# Patient Record
Sex: Male | Born: 1950 | Race: White | Hispanic: No | Marital: Married | Smoking: Former smoker
Health system: Southern US, Community
[De-identification: ages and names within clinical notes are randomized; demographics above are authoritative.]

## PROBLEM LIST (undated history)

## (undated) DIAGNOSIS — F17200 Nicotine dependence, unspecified, uncomplicated: Secondary | ICD-10-CM

---

## 2017-04-30 DIAGNOSIS — K7460 Unspecified cirrhosis of liver: Secondary | ICD-10-CM | POA: Insufficient documentation

## 2017-04-30 HISTORY — DX: Unspecified cirrhosis of liver: K74.60

## 2018-07-22 ENCOUNTER — Telehealth: Payer: Self-pay

## 2018-07-22 NOTE — Telephone Encounter (Signed)
   Pt informed of visitor's policy and to wear a mask. Pt voiced understanding.  COVID-19 Pre-Screening Questions:  . In the past 7 to 10 days have you had a cough,  shortness of breath, headache, congestion, fever (100 or greater) body aches, chills, sore throat, or sudden loss of taste or sense of smell? NO . Have you been around anyone with known Covid 19.NO . Have you been around anyone who is awaiting Covid 19 test results in the past 7 to 10 days?NO . Have you been around anyone who has been exposed to Covid 19, or has mentioned symptoms of Covid 19 within the past 7 to 10 days? NO  If you have any concerns/questions about symptoms patients report during screening (either on the phone or at threshold). Contact the provider seeing the patient or DOD for further guidance.  If neither are available contact a member of the leadership team.           

## 2018-07-25 ENCOUNTER — Telehealth: Payer: Self-pay | Admitting: Cardiology

## 2018-07-25 NOTE — Telephone Encounter (Signed)
I called pt to confirm appt for 07-26-18 with Dr Harrell Gave.

## 2018-07-26 ENCOUNTER — Other Ambulatory Visit: Payer: Self-pay

## 2018-07-26 ENCOUNTER — Encounter: Payer: Self-pay | Admitting: Cardiology

## 2018-07-26 ENCOUNTER — Ambulatory Visit (INDEPENDENT_AMBULATORY_CARE_PROVIDER_SITE_OTHER): Payer: Medicare Other | Admitting: Cardiology

## 2018-07-26 VITALS — BP 102/64 | HR 89 | Temp 98.2°F | Ht 68.0 in | Wt 138.0 lb

## 2018-07-26 DIAGNOSIS — Z7189 Other specified counseling: Secondary | ICD-10-CM

## 2018-07-26 DIAGNOSIS — R600 Localized edema: Secondary | ICD-10-CM

## 2018-07-26 DIAGNOSIS — F17210 Nicotine dependence, cigarettes, uncomplicated: Secondary | ICD-10-CM | POA: Diagnosis not present

## 2018-07-26 DIAGNOSIS — Z716 Tobacco abuse counseling: Secondary | ICD-10-CM

## 2018-07-26 NOTE — Progress Notes (Signed)
Cardiology Office Note:    Date:  07/26/2018   ID:  Obryan, Radu 03/29/1950, MRN [id number]  PCP:  Tempie Hoist, FNP  Cardiologist:  Buford Dresser, MD PhD  Referring MD: No ref. provider found   CC: New patient visit for CV evaluation, LE edema  History of Present Illness:    Bryan Peterson is a 68 y.o. male with a hx of liver masses who is seen as a new patient for the evaluation and management of CV risk and LE edema.  HPI 11/2017, started losing appetite. In the end of march, had foot swelling. Put on diuretic, increased BP medication. Had a colonoscopy last week, Friday met with GI specialist at [facility], concern was for multiple liver lesions being cancer. No cancer diagnosis to this point. Continuing to follow with [facility] for further workup.   He does not have any known history of heart disease, but with everything going on with his health, he wants to make sure that he isn't missing something.  Denies chest pain, shortness of breath at rest or with normal exertion. No PND, orthopnea, or unexpected weight gain (stable now but lost 40 lbs since 11/2017). No syncope or palpitations. Has chronic LE edema that responded to diuretics in the past.   Cardiovascular risk factors: Prior clinical ASCVD:  None that he knows up Comorbid conditions, including hypertension, hyperlipidemia, diabetes, chronic kidney disease: was on lisinopril, but now off since losing weight. No HLD, DM, CKD. Does have history of liver disease, has been followed since mid-April for this.  Metabolic syndrome/Obesity: none Chronic inflammatory conditions: none Tobacco use history: current 1ppd for ~30 years.  Alcohol: down to 1 oz/night. Peak was 3 fifths/week of bourbon about a month ago. Family history: none for heart or stroke Prior cardiac testing and/or incidental findings on other testing (ie coronary calcium): none Exercise level: can work for 20-30 min in the yard, then needs to take a break.   Current diet: poor, minimal appetite  No prior surgeries.  Current Medications: Current Outpatient Medications on File Prior to Visit  Medication Sig  . omeprazole (PRILOSEC) 20 MG capsule Take 20 mg by mouth daily.   No current facility-administered medications on file prior to visit.      Allergies:   Patient has no known allergies.   Social History   Socioeconomic History  . Marital status: Married    Spouse name: Not on file  . Number of children: Not on file  . Years of education: Not on file  . Highest education level: Not on file  Occupational History  . Not on file  Social Needs  . Financial resource strain: Not on file  . Food insecurity    Worry: Not on file    Inability: Not on file  . Transportation needs    Medical: Not on file    Non-medical: Not on file  Tobacco Use  . Smoking status: Former Research scientist (life sciences)  . Smokeless tobacco: Former Network engineer and Sexual Activity  . Alcohol use: Not on file  . Drug use: Not on file  . Sexual activity: Not on file  Lifestyle  . Physical activity    Days per week: Not on file    Minutes per session: Not on file  . Stress: Not on file  Relationships  . Social Herbalist on phone: Not on file    Gets together: Not on file    Attends religious service: Not on file  Active member of club or organization: Not on file    Attends meetings of clubs or organizations: Not on file    Relationship status: Not on file  Other Topics Concern  . Not on file  Social History Narrative  . Not on file     Family History: The patient's family history includes Alzheimer's disease in his mother; Throat cancer in his father.  ROS:   Please see the history of present illness.  Additional pertinent ROS:  Constitutional: Negative for chills, fever, night sweats. Positive for unintentional weight loss and fatigue. HENT: Negative for ear pain and hearing loss.   Eyes: Negative for loss of vision and eye pain.   Respiratory: Negative for cough, sputum, shortness of breath, wheezing.   Cardiovascular: See HPI. Gastrointestinal: Negative for abdominal pain, melena, and hematochezia.  Genitourinary: Negative for dysuria and hematuria.  Musculoskeletal: Negative for falls and myalgias.  Skin: Negative for itching and rash.  Neurological: Negative for focal weakness, focal sensory changes and loss of consciousness.  Endo/Heme/Allergies: Does not bruise/bleed easily.    EKGs/Labs/Other Studies Reviewed:    The following studies were reviewed today: Notes in Care Everywhere  EKG:  EKG is personally reviewed.  The ekg ordered today demonstrates NSR< low voltage limb leads  Recent Labs: No results found for requested labs within last 8760 hours.  Recent Lipid Panel No results found for: CHOL, TRIG, HDL, CHOLHDL, VLDL, LDLCALC, LDLDIRECT  Physical Exam:    VS:  BP 102/64 (BP Location: Right Arm, Patient Position: Sitting, Cuff Size: Normal)   Pulse 89   Temp 98.2 F (36.8 C)   Ht '5\' 8"'  (1.727 m)   Wt 138 lb (62.6 kg)   BMI 20.98 kg/m     Wt Readings from Last 3 Encounters:  07/26/18 138 lb (62.6 kg)     GEN: Frail appearing gentleman in no acute distress HEENT: Normal NECK: No JVD; No carotid bruits LYMPHATICS: No appreciable lymphadenopathy CARDIAC: regular rhythm, normal S1 and S2, no murmurs, rubs, gallops. Radial and DP pulses 2+ bilaterally. RESPIRATORY:  Clear to auscultation without rales, wheezing or rhonchi  ABDOMEN: Soft, non-tender, non-distended, +fluid wave MUSCULOSKELETAL:  Bilateral 1+ pitting edema; No deformity  SKIN: Warm and dry NEUROLOGIC:  Alert and oriented x 3 PSYCHIATRIC:  Normal affect   ASSESSMENT:    1. Lower extremity edema   2. Cardiac risk counseling    PLAN:    LE edema: based on review of notes from [place], he has multiple liver masses and is was investigated for malignancy. 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 -it is reasonable to perform echo to rule out cardiac etiology of his edema. However, I suspect that his edema is largely due to his cirrhosis. -if his echo is unremarkable, I would not pursue additional cardiovascular workup at this time -counseled on alcohol cessation  Tobacco use: The patient was counseled on tobacco cessation today for 4 minutes.  Counseling included reviewing the risks of smoking tobacco products, how it impacts the patient's current medical diagnoses and different strategies for quitting.  Pharmacotherapy to aid in tobacco cessation was not prescribed today.  Cardiac risk counseling and prevention recommendations: -recommend heart healthy/Mediterranean diet, with whole grains, fruits, vegetable, fish, lean meats, nuts, and olive oil. Limit salt. -recommend moderate walking, 3-5 times/week  for 30-50 minutes each session. Aim for at least 150 minutes.week. Goal should be pace of 3 miles/hours, or walking 1.5 miles in 30 minutes -recommend avoidance of tobacco products. Avoid excess alcohol.  Plan for follow up: PRN  Medication Adjustments/Labs and Tests Ordered: Current medicines are reviewed at length with the patient today.  Concerns regarding medicines are outlined above.  Orders Placed This Encounter  Procedures  . EKG 12-Lead  . ECHOCARDIOGRAM COMPLETE   No orders of the defined types were placed in this encounter.   Patient Instructions  Medication Instructions:  Your Physician recommend you continue on your current medication as directed.    If you need a refill on your cardiac medications before your next appointment, please call your pharmacy.   Lab work: None  Testing/Procedures: Your physician has requested that you have an echocardiogram. Echocardiography is a painless test  that uses sound waves to create images of your heart. It provides your doctor with information about the size and shape of your heart and how well your heart's chambers and valves are working. This procedure takes approximately one hour. There are no restrictions for this procedure. [address]   Follow-Up: At [facility], you and your health needs are our priority.  As part of our continuing mission to provide you with exceptional heart care, we have created designated Provider Care Teams.  These Care Teams include your primary Cardiologist (physician) and Advanced Practice Providers (APPs -  Physician Assistants and Nurse Practitioners) who all work together to provide you with the care you need, when you need it. You will need a follow up appointment as needed .  Please call our office 2 months in advance to schedule this appointment.  You may see Dr. Harrell Gave or one of the following Advanced Practice Providers on your designated Care Team:   Rosaria Ferries, PA-C . Jory Sims, DNP, ANP       Signed, Buford Dresser, MD PhD 07/26/2018 4:49 PM    [place]

## 2018-07-26 NOTE — Patient Instructions (Signed)
Medication Instructions:  Your Physician recommend you continue on your current medication as directed.    If you need a refill on your cardiac medications before your next appointment, please call your pharmacy.   Lab work: None  Testing/Procedures: Your physician has requested that you have an echocardiogram. Echocardiography is a painless test that uses sound waves to create images of your heart. It provides your doctor with information about the size and shape of your heart and how well your heart's chambers and valves are working. This procedure takes approximately one hour. There are no restrictions for this procedure. [address] [address]   Follow-Up: At [facility], you and your health needs are our priority.  As part of our continuing mission to provide you with exceptional heart care, we have created designated Provider Care Teams.  These Care Teams include your primary Cardiologist (physician) and Advanced Practice Providers (APPs -  Physician Assistants and Nurse Practitioners) who all work together to provide you with the care you need, when you need it. You will need a follow up appointment as needed .  Please call our office 2 months in advance to schedule this appointment.  You may see Dr. Harrell Gave or one of the following Advanced Practice Providers on your designated Care Team:   Rosaria Ferries, PA-C . Jory Sims, DNP, ANP

## 2018-07-31 ENCOUNTER — Encounter: Payer: Self-pay | Admitting: Cardiology

## 2018-08-09 ENCOUNTER — Other Ambulatory Visit: Payer: Self-pay

## 2018-08-09 ENCOUNTER — Ambulatory Visit (HOSPITAL_COMMUNITY): Payer: Medicare Other | Attending: Cardiology

## 2018-08-09 DIAGNOSIS — R600 Localized edema: Secondary | ICD-10-CM | POA: Insufficient documentation

## 2020-08-28 ENCOUNTER — Other Ambulatory Visit: Payer: Self-pay

## 2020-08-28 ENCOUNTER — Inpatient Hospital Stay (HOSPITAL_COMMUNITY): Payer: Medicare Other

## 2020-08-28 ENCOUNTER — Inpatient Hospital Stay (HOSPITAL_COMMUNITY)
Admission: EM | Admit: 2020-08-28 | Discharge: 2020-08-31 | DRG: 488 | Disposition: A | Discharge status: Home Health | Payer: Medicare Other | Source: Other Acute Inpatient Hospital | Attending: Orthopedic Surgery | Admitting: Orthopedic Surgery

## 2020-08-28 ENCOUNTER — Encounter (HOSPITAL_COMMUNITY): Payer: Self-pay

## 2020-08-28 DIAGNOSIS — Z808 Family history of malignant neoplasm of other organs or systems: Secondary | ICD-10-CM

## 2020-08-28 DIAGNOSIS — S82141A Displaced bicondylar fracture of right tibia, initial encounter for closed fracture: Secondary | ICD-10-CM | POA: Diagnosis present

## 2020-08-28 DIAGNOSIS — Z87891 Personal history of nicotine dependence: Secondary | ICD-10-CM | POA: Diagnosis not present

## 2020-08-28 DIAGNOSIS — Z820 Family history of epilepsy and other diseases of the nervous system: Secondary | ICD-10-CM

## 2020-08-28 DIAGNOSIS — Z20822 Contact with and (suspected) exposure to covid-19: Secondary | ICD-10-CM | POA: Diagnosis present

## 2020-08-28 DIAGNOSIS — K7460 Unspecified cirrhosis of liver: Secondary | ICD-10-CM | POA: Diagnosis present

## 2020-08-28 DIAGNOSIS — W11XXXA Fall on and from ladder, initial encounter: Secondary | ICD-10-CM | POA: Diagnosis present

## 2020-08-28 DIAGNOSIS — F17200 Nicotine dependence, unspecified, uncomplicated: Secondary | ICD-10-CM | POA: Diagnosis present

## 2020-08-28 DIAGNOSIS — K7469 Other cirrhosis of liver: Secondary | ICD-10-CM | POA: Diagnosis present

## 2020-08-28 DIAGNOSIS — T148XXA Other injury of unspecified body region, initial encounter: Secondary | ICD-10-CM

## 2020-08-28 DIAGNOSIS — D62 Acute posthemorrhagic anemia: Secondary | ICD-10-CM | POA: Diagnosis not present

## 2020-08-28 DIAGNOSIS — Z419 Encounter for procedure for purposes other than remedying health state, unspecified: Secondary | ICD-10-CM

## 2020-08-28 HISTORY — DX: Nicotine dependence, unspecified, uncomplicated: F17.200

## 2020-08-28 LAB — VITAMIN D 25 HYDROXY (VIT D DEFICIENCY, FRACTURES): Vit D, 25-Hydroxy: 35.31 ng/mL (ref 30–100)

## 2020-08-28 LAB — COMPREHENSIVE METABOLIC PANEL
ALT: 8 U/L (ref 0–44)
AST: 18 U/L (ref 15–41)
Albumin: 3.3 g/dL — ABNORMAL LOW (ref 3.5–5.0)
Alkaline Phosphatase: 77 U/L (ref 38–126)
Anion gap: 5 (ref 5–15)
BUN: 14 mg/dL (ref 8–23)
CO2: 25 mmol/L (ref 22–32)
Calcium: 8.6 mg/dL — ABNORMAL LOW (ref 8.9–10.3)
Chloride: 106 mmol/L (ref 98–111)
Creatinine, Ser: 1.36 mg/dL — ABNORMAL HIGH (ref 0.61–1.24)
GFR, Estimated: 56 mL/min — ABNORMAL LOW (ref >60)
Glucose, Bld: 99 mg/dL (ref 70–99)
Potassium: 3.7 mmol/L (ref 3.5–5.1)
Sodium: 136 mmol/L (ref 135–145)
Total Bilirubin: 1 mg/dL (ref 0.3–1.2)
Total Protein: 6.3 g/dL — ABNORMAL LOW (ref 6.5–8.1)

## 2020-08-28 LAB — CBC
HCT: 36.1 % — ABNORMAL LOW (ref 39.0–52.0)
Hemoglobin: 12 g/dL — ABNORMAL LOW (ref 13.0–17.0)
MCH: 32 pg (ref 26.0–34.0)
MCHC: 33.2 g/dL (ref 30.0–36.0)
MCV: 96.3 fL (ref 80.0–100.0)
Platelets: 205 10*3/uL (ref 150–400)
RBC: 3.75 MIL/uL — ABNORMAL LOW (ref 4.22–5.81)
RDW: 13.4 % (ref 11.5–15.5)
WBC: 7.3 10*3/uL (ref 4.0–10.5)
nRBC: 0 % (ref 0.0–0.2)

## 2020-08-28 LAB — RESP PANEL BY RT-PCR (FLU A&B, COVID) ARPGX2
Influenza A by PCR: NEGATIVE
Influenza B by PCR: NEGATIVE
SARS Coronavirus 2 by RT PCR: NEGATIVE

## 2020-08-28 MED ORDER — OXYCODONE HCL 5 MG PO TABS
5.0000 mg | ORAL_TABLET | ORAL | Status: DC | PRN
Start: 2020-08-28 — End: 2020-08-31
  Administered 2020-08-28 – 2020-08-30 (×2): 5 mg via ORAL
  Filled 2020-08-28: qty 1
  Filled 2020-08-28: qty 2
  Filled 2020-08-28: qty 1

## 2020-08-28 MED ORDER — ACETAMINOPHEN 325 MG PO TABS
650.0000 mg | ORAL_TABLET | Freq: Four times a day (QID) | ORAL | Status: DC
  Administered 2020-08-29 – 2020-08-31 (×9): 650 mg via ORAL
  Filled 2020-08-28 (×9): qty 2

## 2020-08-28 MED ORDER — ONDANSETRON HCL 4 MG PO TABS
4.0000 mg | ORAL_TABLET | Freq: Four times a day (QID) | ORAL | Status: DC | PRN
  Administered 2020-08-29: 4 mg via ORAL
  Filled 2020-08-28: qty 1

## 2020-08-28 MED ORDER — POTASSIUM CHLORIDE IN NACL 20-0.9 MEQ/L-% IV SOLN
INTRAVENOUS | Status: DC
Start: 2020-08-28 — End: 2020-08-30
  Filled 2020-08-28: qty 1000

## 2020-08-28 MED ORDER — HYDRALAZINE HCL 10 MG PO TABS: 10.0000 mg | ORAL_TABLET | Freq: Four times a day (QID) | ORAL | Status: DC | PRN

## 2020-08-28 MED ORDER — DOCUSATE SODIUM 100 MG PO CAPS
100.0000 mg | ORAL_CAPSULE | Freq: Two times a day (BID) | ORAL | Status: DC
  Administered 2020-08-28 – 2020-08-29 (×2): 100 mg via ORAL
  Filled 2020-08-28 (×2): qty 1

## 2020-08-28 MED ORDER — METOCLOPRAMIDE HCL 5 MG/ML IJ SOLN: 5.0000 mg | Freq: Three times a day (TID) | INTRAMUSCULAR | Status: DC | PRN

## 2020-08-28 MED ORDER — METHOCARBAMOL 1000 MG/10ML IJ SOLN
500.0000 mg | Freq: Four times a day (QID) | INTRAVENOUS | Status: DC | PRN
  Filled 2020-08-28 (×4): qty 5

## 2020-08-28 MED ORDER — METHOCARBAMOL 500 MG PO TABS
500.0000 mg | ORAL_TABLET | Freq: Four times a day (QID) | ORAL | Status: DC | PRN
  Administered 2020-08-29: 500 mg via ORAL
  Filled 2020-08-28: qty 1

## 2020-08-28 MED ORDER — PANTOPRAZOLE SODIUM 40 MG PO TBEC
40.0000 mg | DELAYED_RELEASE_TABLET | Freq: Every day | ORAL | Status: DC
  Filled 2020-08-28: qty 1

## 2020-08-28 MED ORDER — ONDANSETRON HCL 4 MG/2ML IJ SOLN
4.0000 mg | Freq: Four times a day (QID) | INTRAMUSCULAR | Status: DC | PRN
  Administered 2020-08-30: 4 mg via INTRAVENOUS

## 2020-08-28 MED ORDER — METOCLOPRAMIDE HCL 10 MG PO TABS
5.0000 mg | ORAL_TABLET | Freq: Three times a day (TID) | ORAL | Status: DC | PRN
Start: 2020-08-28 — End: 2020-08-30
  Filled 2020-08-28: qty 1

## 2020-08-28 MED ORDER — HYDROMORPHONE HCL 1 MG/ML IJ SOLN
0.5000 mg | INTRAMUSCULAR | Status: DC | PRN
  Administered 2020-08-29: 1 mg via INTRAVENOUS
  Filled 2020-08-28: qty 1

## 2020-08-28 MED ORDER — ENOXAPARIN SODIUM 40 MG/0.4ML IJ SOSY
40.0000 mg | PREFILLED_SYRINGE | INTRAMUSCULAR | Status: DC
Start: 2020-08-28 — End: 2020-08-30
  Administered 2020-08-28: 40 mg via SUBCUTANEOUS
  Filled 2020-08-28 (×2): qty 0.4

## 2020-08-28 MED ORDER — POTASSIUM CHLORIDE IN NACL 20-0.9 MEQ/L-% IV SOLN
INTRAVENOUS | Status: DC
  Filled 2020-08-28 (×2): qty 1000

## 2020-08-28 NOTE — ED Notes (Signed)
Pt given urinal.

## 2020-08-28 NOTE — ED Triage Notes (Signed)
Pt from home. Transfer from [facility] for trauma cx. Pt was on a step ladder cutting trees and fell. C/o left knee pain and swelling. CT shows fracture involving the proximal tibia shaft.

## 2020-08-28 NOTE — H&P (Signed)
Orthopaedic Trauma Service (OTS) H&P  Patient IDCaliph Peterson MRN: [id number] DOB/AGE: 70/22/1952 70 y.o.  Reason for Consult: Right proximal tibia fracture Referring Physician: Dr. Samson Frederic, MD Raechel Chute)  HPI: Bryan Peterson is an 70 y.o. male being seen in consultation at request of Dr. Linna Caprice for right proximal tibia fracture.  Patient presented to emergency department in [place], [place] for evaluation of right leg pain after fall from step ladder.  Was found to have right proximal tibia fracture.  Orthopedics locally was consulted but did not feel comfortable with the surgical repair.  He was was subsequently transferred to [facility].  Upon arrival, orthopedics was consulted for evaluation and management.  Dr. Linna Caprice felt that this was outside the scope of practice and asked OTS to assume care.  Patient seen this evening in emergency department.  Currently having pain in right lower extremity, rates it 5/10.  Denies injury to any other extremities.  Denies any significant numbness or tingling.  Has history of cirrhosis which has require paracentesis in the past but otherwise no significant past medical history.  Not currently on any anticoagulation. Live at home with his wife. Is retired. Uses no assistive device at baseline  History reviewed. No pertinent past medical history.  History reviewed. No pertinent surgical history.  Family History  Problem Relation Age of Onset   Alzheimer's disease Mother    Throat cancer Father     Social History:  reports that he has quit smoking. He has quit using smokeless tobacco. No history on file for alcohol use and drug use.  Allergies: No Known Allergies  Medications: I have reviewed the patient's current medications. Prior to Admission: (Not in a hospital admission)  ROS: Constitutional: No fever or chills Vision: No changes in vision ENT: No difficulty swallowing CV: No chest pain Pulm: No SOB or wheezing GI: No nausea or  vomiting GU: No urgency or inability to hold urine Skin: No poor wound healing Neurologic: No numbness or tingling Psychiatric: No depression or anxiety Heme: No bruising Allergic: No reaction to medications or food   Exam: Blood pressure 128/67, pulse 69, temperature 98.3 F (36.8 C), temperature source Oral, resp. rate 17, height 5\' 8"  (1.727 m), weight 75.8 kg, SpO2 97 %. General: Sitting up in bed, no acute distress Orientation: Alert and oriented x4 Mood and Affect: Mood and affect appropriate, pleasant and cooperative Gait: Not assessed due to known fracture Coordination and balance: Within normal limits  Right lower extremity: KI in place. Swelling about knee and throughout calf. Skin without lesions. Tender with palpation about knee and proximal tibia. No significant calf tenderness. Knee motion not attempted. Ankle DF/PF intact. No pain with passive stretch. Endorses sensation to light touch throughout extremity. Skin warm and dry. Thigh compartments soft and compressible. Lower leg swollen but compartments easily compressible. 2+ DP pulse with brisk cap refill  Left lower extremity: Skin without lesions. No tenderness to palpation. Full painless ROM. Full strength in each muscle group. Sensation grossly intact. Otherwise neurovascularly intact.   Medical Decision Making: Data: Imaging: X-rays done on emergency department in [place], [place] reviewed which showed right proximal tibia fracture.  Imaging order: Repeat knee x-rays  Labs:  Results for orders placed or performed during the hospital encounter of 08/28/20 (from the past 24 hour(s))  Resp Panel by RT-PCR (Flu A&B, Covid) Nasopharyngeal Swab     Status: None   Collection Time: 08/28/20  6:32 PM   Specimen: Nasopharyngeal Swab; Nasopharyngeal(NP) swabs in vial transport  medium  Result Value Ref Range   SARS Coronavirus 2 by RT PCR NEGATIVE NEGATIVE   Influenza A by PCR NEGATIVE NEGATIVE   Influenza B by PCR NEGATIVE  NEGATIVE     Assessment/Plan: 70 year old male with no significant PMH presenting with right proximal tibia fracture after fall from ladder  Patient with significant injury to right lower extremity which will require surgical intervention.  Recommend proceeding with open reduction internal fixation of right proximal tibia fracture.  We will admit to orthopedic trauma service this evening.  Physical exam at this time not concerning for compartment syndrome but we will continue to this closely with q 4 hour neuro/vascular checks. We will plan to arrange surgery for tomorrow (Sunday, 08/29/2020) with Dr. Jena Gauss or Monday (08/30/2020) with Dr. Carola Frost.  Will allow patient a diet now but plan for NPO at midnight in the event surgery is able to be performed tomorrow.  Maintain knee immobilizer to right lower extremity.    Nicosha Struve A. Ladonna Snide [facility] [phone] (office) [email or web address]

## 2020-08-28 NOTE — ED Provider Notes (Signed)
[facility] EMERGENCY DEPARTMENT Provider Note   CSN: [id number] Arrival date & time: 08/28/20  1807     History Chief Complaint  Patient presents with  . Leg Injury    Bryan Peterson is a 70 y.o. male.  70 yo M with a chief complaint of falling off of a ladder.  This happened earlier and he went to an outside emergency department was found to have a right-sided tibial plateau fracture.  Orthopedics locally was not comfortable with the surgical repair.  He was then sent  The history is provided by the patient.  Illness Severity:  Moderate Onset quality:  Gradual Duration:  12 hours Timing:  Constant Progression:  Unchanged Chronicity:  New Associated symptoms: no abdominal pain, no chest pain, no congestion, no diarrhea, no fever, no headaches, no myalgias, no rash, no shortness of breath and no vomiting       History reviewed. No pertinent past medical history.  Patient Active Problem List   Diagnosis Date Noted  . Tibial plateau fracture, right 08/28/2020  . Closed fracture of right tibial plateau 08/28/2020    History reviewed. No pertinent surgical history.     Family History  Problem Relation Age of Onset  . Alzheimer's disease Mother   . Throat cancer Father     Social History   Tobacco Use  . Smoking status: Former  . Smokeless tobacco: Former    Home Medications Prior to Admission medications   Medication Sig Start Date End Date Taking? Authorizing Provider  omeprazole (PRILOSEC) 20 MG capsule Take 20 mg by mouth daily.    [provider]    Allergies    Patient has no known allergies.  Review of Systems   Review of Systems  Constitutional:  Negative for chills and fever.  HENT:  Negative for congestion and facial swelling.   Eyes:  Negative for discharge and visual disturbance.  Respiratory:  Negative for shortness of breath.   Cardiovascular:  Negative for chest pain and palpitations.  Gastrointestinal:  Negative  for abdominal pain, diarrhea and vomiting.  Musculoskeletal:  Positive for arthralgias. Negative for myalgias.  Skin:  Negative for color change and rash.  Neurological:  Negative for tremors, syncope and headaches.  Psychiatric/Behavioral:  Negative for confusion and dysphoric mood.    Physical Exam Updated Vital Signs BP 126/74 (BP Location: Right Arm)   Pulse 68   Temp 98.2 F (36.8 C) (Oral)   Resp 16   SpO2 98%   Physical Exam Vitals and nursing note reviewed.  Constitutional:      Appearance: He is well-developed.  HENT:     Head: Normocephalic and atraumatic.  Eyes:     Pupils: Pupils are equal, round, and reactive to light.  Neck:     Vascular: No JVD.  Cardiovascular:     Rate and Rhythm: Normal rate and regular rhythm.     Heart sounds: No murmur heard.   No friction rub. No gallop.  Pulmonary:     Effort: No respiratory distress.     Breath sounds: No wheezing.  Abdominal:     General: There is no distension.     Tenderness: There is no abdominal tenderness. There is no guarding or rebound.  Musculoskeletal:        General: Swelling and tenderness present. Normal range of motion.     Cervical back: Normal range of motion and neck supple.     Comments: Pain and swelling to the proximal tibia.  Pulse motor and sensation intact distally.  Skin:    Coloration: Skin is not pale.     Findings: No rash.  Neurological:     Mental Status: He is alert and oriented to person, place, and time.  Psychiatric:        Behavior: Behavior normal.    ED Results / Procedures / Treatments   Labs (all labs ordered are listed, but only abnormal results are displayed) Labs Reviewed  RESP PANEL BY RT-PCR (FLU A&B, COVID) ARPGX2  VITAMIN D 25 HYDROXY (VIT D DEFICIENCY, FRACTURES)  CBC  COMPREHENSIVE METABOLIC PANEL    EKG None  Radiology No results found.  Procedures Procedures   Medications Ordered in ED Medications  pantoprazole (PROTONIX) EC tablet 40 mg (has  no administration in time range)  0.9 % NaCl with KCl 20 mEq/ L  infusion (has no administration in time range)  methocarbamol (ROBAXIN) tablet 500 mg (has no administration in time range)    Or  methocarbamol (ROBAXIN) 500 mg in dextrose 5 % 50 mL IVPB (has no administration in time range)  docusate sodium (COLACE) capsule 100 mg (has no administration in time range)  ondansetron (ZOFRAN) tablet 4 mg (has no administration in time range)    Or  ondansetron (ZOFRAN) injection 4 mg (has no administration in time range)  metoCLOPramide (REGLAN) tablet 5-10 mg (has no administration in time range)    Or  metoCLOPramide (REGLAN) injection 5-10 mg (has no administration in time range)  enoxaparin (LOVENOX) injection 40 mg (has no administration in time range)  oxyCODONE (Oxy IR/ROXICODONE) immediate release tablet 5-10 mg (has no administration in time range)  HYDROmorphone (DILAUDID) injection 0.5-1 mg (has no administration in time range)  acetaminophen (TYLENOL) tablet 650 mg (has no administration in time range)  hydrALAZINE (APRESOLINE) tablet 10 mg (has no administration in time range)    ED Course  I have reviewed the triage vital signs and the nursing notes.  Pertinent labs & imaging results that were available during my care of the patient were reviewed by me and considered in my medical decision making (see chart for details).    MDM Rules/Calculators/A&P                           70 yo M with a chief complaint of a fall off of a ladder.  Found to have a tibial plateau fracture at an outside hospital.  Blood work reviewed from outside hospital no anemia, renal function appears to be at his baseline.  No significant electrolyte abnormality.  I discussed case with Dr. Jena Gauss, orthopedics recommended placing the patient in a knee immobilizer and admitting him with likely surgical repair on Monday.  The patients results and plan were reviewed and discussed.   Any x-rays performed  were independently reviewed by myself.   Differential diagnosis were considered with the presenting HPI.  Medications  pantoprazole (PROTONIX) EC tablet 40 mg (has no administration in time range)  0.9 % NaCl with KCl 20 mEq/ L  infusion (has no administration in time range)  methocarbamol (ROBAXIN) tablet 500 mg (has no administration in time range)    Or  methocarbamol (ROBAXIN) 500 mg in dextrose 5 % 50 mL IVPB (has no administration in time range)  docusate sodium (COLACE) capsule 100 mg (has no administration in time range)  ondansetron (ZOFRAN) tablet 4 mg (has no administration in time range)    Or  ondansetron (ZOFRAN) injection  4 mg (has no administration in time range)  metoCLOPramide (REGLAN) tablet 5-10 mg (has no administration in time range)    Or  metoCLOPramide (REGLAN) injection 5-10 mg (has no administration in time range)  enoxaparin (LOVENOX) injection 40 mg (has no administration in time range)  oxyCODONE (Oxy IR/ROXICODONE) immediate release tablet 5-10 mg (has no administration in time range)  HYDROmorphone (DILAUDID) injection 0.5-1 mg (has no administration in time range)  acetaminophen (TYLENOL) tablet 650 mg (has no administration in time range)  hydrALAZINE (APRESOLINE) tablet 10 mg (has no administration in time range)    Vitals:   08/28/20 1812  BP: 126/74  Pulse: 68  Resp: 16  Temp: 98.2 F (36.8 C)  TempSrc: Oral  SpO2: 98%    Final diagnoses:  Tibial plateau fracture, right, closed, initial encounter    Admission/ observation were discussed with the admitting physician, patient and/or family and they are comfortable with the plan.   Final Clinical Impression(s) / ED Diagnoses Final diagnoses:  Tibial plateau fracture, right, closed, initial encounter    Rx / DC Orders ED Discharge Orders     None        Melene Plan, DO 08/28/20 1933

## 2020-08-28 NOTE — ED Notes (Signed)
Pt taken to xray 

## 2020-08-29 ENCOUNTER — Encounter (HOSPITAL_COMMUNITY): Payer: Self-pay | Admitting: Student

## 2020-08-29 LAB — GLUCOSE, CAPILLARY: Glucose-Capillary: 102 mg/dL — ABNORMAL HIGH (ref 70–99)

## 2020-08-29 MED ORDER — PANTOPRAZOLE SODIUM 40 MG PO TBEC
40.0000 mg | DELAYED_RELEASE_TABLET | Freq: Every day | ORAL | Status: DC
  Administered 2020-08-29 – 2020-08-31 (×2): 40 mg via ORAL
  Filled 2020-08-29 (×2): qty 1

## 2020-08-29 MED ORDER — ENSURE PRE-SURGERY PO LIQD
296.0000 mL | Freq: Once | ORAL | Status: DC
  Filled 2020-08-29: qty 296

## 2020-08-29 MED ORDER — POVIDONE-IODINE 10 % EX SWAB: 2.0000 "application " | Freq: Once | CUTANEOUS | Status: DC

## 2020-08-29 MED ORDER — CHLORHEXIDINE GLUCONATE 4 % EX LIQD
60.0000 mL | Freq: Once | CUTANEOUS | Status: AC
Start: 2020-08-29 — End: 2020-08-30
  Administered 2020-08-30: 4 via TOPICAL
  Filled 2020-08-29: qty 60

## 2020-08-29 MED ORDER — FUROSEMIDE 20 MG PO TABS: 20.0000 mg | ORAL_TABLET | Freq: Every day | ORAL | Status: DC | PRN

## 2020-08-29 MED ORDER — TRAZODONE HCL 50 MG PO TABS
50.0000 mg | ORAL_TABLET | Freq: Every day | ORAL | Status: DC
  Administered 2020-08-29 – 2020-08-30 (×2): 50 mg via ORAL
  Filled 2020-08-29 (×2): qty 1

## 2020-08-29 MED ORDER — MUPIROCIN 2 % EX OINT
1.0000 "application " | TOPICAL_OINTMENT | Freq: Two times a day (BID) | CUTANEOUS | Status: DC
  Administered 2020-08-30: 1 via NASAL
  Filled 2020-08-29: qty 22

## 2020-08-29 MED ORDER — CEFAZOLIN SODIUM-DEXTROSE 2-4 GM/100ML-% IV SOLN
2.0000 g | INTRAVENOUS | Status: AC
  Administered 2020-08-30: 2 g via INTRAVENOUS
  Filled 2020-08-29: qty 100

## 2020-08-29 NOTE — ED Notes (Signed)
This RN paged on call to ensure new diet order was correct. Pt is under the impression he is possibly going to surgery today

## 2020-08-29 NOTE — Plan of Care (Signed)
  Problem: Education: Goal: Knowledge of General Education information will improve Description: Including pain rating scale, medication(s)/side effects and non-pharmacologic comfort measures Outcome: Progressing   Problem: Health Behavior/Discharge Planning: Goal: Ability to manage health-related needs will improve Outcome: Progressing   Problem: Pain Managment: Goal: General experience of comfort will improve Outcome: Progressing   

## 2020-08-29 NOTE — Anesthesia Preprocedure Evaluation (Addendum)
Anesthesia Evaluation  Patient identified by MRN, date of birth, ID band Patient awake    Reviewed: Allergy & Precautions, NPO status , Patient's Chart, lab work & pertinent test results  Airway Mallampati: II  TM Distance: >3 FB Neck ROM: Full    Dental  (+) Poor Dentition Many broken and decayed teeth. Patient states none are loose:   Pulmonary former smoker,    Pulmonary exam normal breath sounds clear to auscultation       Cardiovascular Exercise Tolerance: Good Normal cardiovascular exam Rhythm:Regular Rate:Normal     Neuro/Psych negative neurological ROS  negative psych ROS   GI/Hepatic (+) Cirrhosis       ,   Endo/Other  negative endocrine ROS  Renal/GU negative Renal ROS  negative genitourinary   Musculoskeletal negative musculoskeletal ROS (+)   Abdominal   Peds  Hematology  (+) anemia ,   Anesthesia Other Findings Tibial plateau fx  Reproductive/Obstetrics                            Anesthesia Physical Anesthesia Plan  ASA: 2  Anesthesia Plan: General   Post-op Pain Management:    Induction:   PONV Risk Score and Plan: 2  Airway Management Planned: Oral ETT  Additional Equipment: None  Intra-op Plan:   Post-operative Plan: Extubation in OR  Informed Consent: I have reviewed the patients History and Physical, chart, labs and discussed the procedure including the risks, benefits and alternatives for the proposed anesthesia with the patient or authorized representative who has indicated his/her understanding and acceptance.     Dental advisory given and Consent reviewed with POA  Plan Discussed with: CRNA and Anesthesiologist  Anesthesia Plan Comments: (Very poor dentition. Advised patient he is at risk of a tooth becoming dislodged with possible swallowing or aspiration of the teeth. He expressed understanding and wishes to proceed. GETA. Existing IV. Tanna Furry, MD  )       Anesthesia Quick Evaluation

## 2020-08-29 NOTE — ED Notes (Addendum)
RN spoke to PA, pt can eat at this time. Surgery will be scheduled for tomorrow, Monday 8/1 Pt given a [place] sandwich, crackers and cheese

## 2020-08-29 NOTE — Progress Notes (Signed)
   ORTHOPAEDIC PROGRESS NOTE  s/p right proximal tibia fracture  SUBJECTIVE: Reports pain in the right knee. Controlled with medications. Wife at bedside. No chest pain. No SOB. No nausea/vomiting. No other complaints.  OBJECTIVE: PE: General: sitting up in hospital bed, NAD Right lower extremity: KI in place, swelling right knee and lower leg, small fracture blister present around the area of tibial tubercle, compartments swollen but compressible, no pain with passive stretch, ROM of knee not tested in setting of known fracture, intact EHL/TA/GSC, endorses sensation distally, warm well perfused foot   Vitals:   08/29/20 0900 08/29/20 1100  BP: 130/63 135/72  Pulse: 67 82  Resp: 12 (!) 26  Temp: 98 F (36.7 C) 97.7 F (36.5 C)  SpO2: 100% 90%     ASSESSMENT: Bryan Peterson is a 70 y.o. male  - right proximal tibia fracture after fall from ladder  PLAN: Weightbearing: NWB RLE Insicional and dressing care: n/a Orthopedic device(s): Knee immobilizer Showering: Hold for now VTE prophylaxis: SCDs Pain control: PRN pain medications. Ice. Elevation  Patient with significant injury to right lower extremity which will require surgical intervention.  Recommend proceeding with open reduction internal fixation of right proximal tibia fracture.  Patient has been admitted to orthopedic trauma service.    - Plan for surgery on Monday (08/30/2020) with Dr. Carola Frost.  - NPO midnight - Maintain knee immobilizer to right lower extremity.  Contact information:  After hours and holidays please check [email or web address] for group call information for [facility]  Alfonse Alpers, PA-C 08/29/2020

## 2020-08-30 ENCOUNTER — Encounter (HOSPITAL_COMMUNITY): Payer: Self-pay | Admitting: Student

## 2020-08-30 ENCOUNTER — Inpatient Hospital Stay (HOSPITAL_COMMUNITY): Payer: Medicare Other

## 2020-08-30 ENCOUNTER — Inpatient Hospital Stay (HOSPITAL_COMMUNITY): Payer: Medicare Other | Admitting: Anesthesiology

## 2020-08-30 ENCOUNTER — Encounter (HOSPITAL_COMMUNITY)
Admission: EM | Disposition: A | Discharge status: Home Health | Payer: Self-pay | Source: Other Acute Inpatient Hospital | Attending: Student

## 2020-08-30 HISTORY — PX: ORIF TIBIA PLATEAU: SHX2132

## 2020-08-30 LAB — CBC
HCT: 33.7 % — ABNORMAL LOW (ref 39.0–52.0)
Hemoglobin: 11.2 g/dL — ABNORMAL LOW (ref 13.0–17.0)
MCH: 31.6 pg (ref 26.0–34.0)
MCHC: 33.2 g/dL (ref 30.0–36.0)
MCV: 95.2 fL (ref 80.0–100.0)
Platelets: 196 10*3/uL (ref 150–400)
RBC: 3.54 MIL/uL — ABNORMAL LOW (ref 4.22–5.81)
RDW: 13 % (ref 11.5–15.5)
WBC: 6.2 10*3/uL (ref 4.0–10.5)
nRBC: 0 % (ref 0.0–0.2)

## 2020-08-30 LAB — TYPE AND SCREEN
ABO/RH(D): A POS
Antibody Screen: NEGATIVE

## 2020-08-30 LAB — COMPREHENSIVE METABOLIC PANEL
ALT: 11 U/L (ref 0–44)
AST: 18 U/L (ref 15–41)
Albumin: 2.9 g/dL — ABNORMAL LOW (ref 3.5–5.0)
Alkaline Phosphatase: 72 U/L (ref 38–126)
Anion gap: 7 (ref 5–15)
BUN: 15 mg/dL (ref 8–23)
CO2: 21 mmol/L — ABNORMAL LOW (ref 22–32)
Calcium: 8.4 mg/dL — ABNORMAL LOW (ref 8.9–10.3)
Chloride: 105 mmol/L (ref 98–111)
Creatinine, Ser: 1.29 mg/dL — ABNORMAL HIGH (ref 0.61–1.24)
GFR, Estimated: >60 mL/min (ref >60)
Glucose, Bld: 129 mg/dL — ABNORMAL HIGH (ref 70–99)
Potassium: 3.9 mmol/L (ref 3.5–5.1)
Sodium: 133 mmol/L — ABNORMAL LOW (ref 135–145)
Total Bilirubin: 1 mg/dL (ref 0.3–1.2)
Total Protein: 5.5 g/dL — ABNORMAL LOW (ref 6.5–8.1)

## 2020-08-30 LAB — SURGICAL PCR SCREEN
MRSA, PCR: NEGATIVE
Staphylococcus aureus: NEGATIVE

## 2020-08-30 LAB — ABO/RH: ABO/RH(D): A POS

## 2020-08-30 LAB — PROTIME-INR
INR: 1.2 (ref 0.8–1.2)
Prothrombin Time: 15.2 seconds (ref 11.4–15.2)

## 2020-08-30 SURGERY — OPEN REDUCTION INTERNAL FIXATION (ORIF) TIBIAL PLATEAU
Anesthesia: General | Site: Leg Upper | Laterality: Right

## 2020-08-30 MED ORDER — ALBUMIN HUMAN 5 % IV SOLN: INTRAVENOUS | Status: DC | PRN

## 2020-08-30 MED ORDER — SUGAMMADEX SODIUM 200 MG/2ML IV SOLN
INTRAVENOUS | Status: DC | PRN
  Administered 2020-08-30: 200 mg via INTRAVENOUS

## 2020-08-30 MED ORDER — ENOXAPARIN SODIUM 40 MG/0.4ML IJ SOSY
40.0000 mg | PREFILLED_SYRINGE | INTRAMUSCULAR | Status: DC
Start: 2020-08-31 — End: 2020-08-31
  Administered 2020-08-31: 40 mg via SUBCUTANEOUS
  Filled 2020-08-30: qty 0.4

## 2020-08-30 MED ORDER — ONDANSETRON HCL 4 MG/2ML IJ SOLN
INTRAMUSCULAR | Status: AC
  Filled 2020-08-30: qty 2

## 2020-08-30 MED ORDER — FENTANYL CITRATE (PF) 250 MCG/5ML IJ SOLN
INTRAMUSCULAR | Status: DC | PRN
  Administered 2020-08-30 (×5): 50 ug via INTRAVENOUS

## 2020-08-30 MED ORDER — DEXAMETHASONE SODIUM PHOSPHATE 10 MG/ML IJ SOLN
INTRAMUSCULAR | Status: AC
  Filled 2020-08-30: qty 1

## 2020-08-30 MED ORDER — FENTANYL CITRATE (PF) 250 MCG/5ML IJ SOLN
INTRAMUSCULAR | Status: AC
  Filled 2020-08-30: qty 5

## 2020-08-30 MED ORDER — DEXMEDETOMIDINE HCL IN NACL 200 MCG/50ML IV SOLN
INTRAVENOUS | Status: AC
  Filled 2020-08-30: qty 50

## 2020-08-30 MED ORDER — OXYCODONE HCL 5 MG PO TABS: 5.0000 mg | ORAL_TABLET | Freq: Once | ORAL | Status: DC | PRN

## 2020-08-30 MED ORDER — CHLORHEXIDINE GLUCONATE 0.12 % MT SOLN
OROMUCOSAL | Status: AC
  Administered 2020-08-30: 15 mL via OROMUCOSAL
  Filled 2020-08-30: qty 15

## 2020-08-30 MED ORDER — POTASSIUM CHLORIDE IN NACL 20-0.9 MEQ/L-% IV SOLN
INTRAVENOUS | Status: DC
  Filled 2020-08-30 (×2): qty 1000

## 2020-08-30 MED ORDER — MIDAZOLAM HCL 2 MG/2ML IJ SOLN
INTRAMUSCULAR | Status: DC | PRN
  Administered 2020-08-30 (×2): 2 mg via INTRAVENOUS

## 2020-08-30 MED ORDER — DEXMEDETOMIDINE HCL IN NACL 200 MCG/50ML IV SOLN
INTRAVENOUS | Status: DC | PRN
  Administered 2020-08-30: 4 ug via INTRAVENOUS
  Administered 2020-08-30 (×2): 8 ug via INTRAVENOUS

## 2020-08-30 MED ORDER — ONDANSETRON HCL 4 MG/2ML IJ SOLN: 4.0000 mg | Freq: Four times a day (QID) | INTRAMUSCULAR | Status: DC | PRN

## 2020-08-30 MED ORDER — OXYCODONE HCL 5 MG/5ML PO SOLN
5.0000 mg | Freq: Once | ORAL | Status: DC | PRN
Start: 2020-08-30 — End: 2020-08-30

## 2020-08-30 MED ORDER — PROMETHAZINE HCL 25 MG/ML IJ SOLN: 6.2500 mg | INTRAMUSCULAR | Status: DC | PRN

## 2020-08-30 MED ORDER — ONDANSETRON HCL 4 MG PO TABS: 4.0000 mg | ORAL_TABLET | Freq: Four times a day (QID) | ORAL | Status: DC | PRN

## 2020-08-30 MED ORDER — CHLORHEXIDINE GLUCONATE 0.12 % MT SOLN: 15.0000 mL | Freq: Once | OROMUCOSAL | Status: AC

## 2020-08-30 MED ORDER — POLYETHYLENE GLYCOL 3350 17 G PO PACK
17.0000 g | PACK | Freq: Every day | ORAL | Status: DC
  Administered 2020-08-30 – 2020-08-31 (×2): 17 g via ORAL
  Filled 2020-08-30 (×2): qty 1

## 2020-08-30 MED ORDER — KETOROLAC TROMETHAMINE 15 MG/ML IJ SOLN
15.0000 mg | Freq: Three times a day (TID) | INTRAMUSCULAR | Status: DC
  Administered 2020-08-30 – 2020-08-31 (×4): 15 mg via INTRAVENOUS
  Filled 2020-08-30 (×4): qty 1

## 2020-08-30 MED ORDER — PROPOFOL 10 MG/ML IV BOLUS
INTRAVENOUS | Status: DC | PRN
  Administered 2020-08-30: 150 mg via INTRAVENOUS

## 2020-08-30 MED ORDER — LIDOCAINE 2% (20 MG/ML) 5 ML SYRINGE
INTRAMUSCULAR | Status: AC
  Filled 2020-08-30: qty 5

## 2020-08-30 MED ORDER — METOCLOPRAMIDE HCL 5 MG/ML IJ SOLN: 5.0000 mg | Freq: Three times a day (TID) | INTRAMUSCULAR | Status: DC | PRN

## 2020-08-30 MED ORDER — ORAL CARE MOUTH RINSE: 15.0000 mL | Freq: Once | OROMUCOSAL | Status: AC

## 2020-08-30 MED ORDER — METOCLOPRAMIDE HCL 5 MG PO TABS: 5.0000 mg | ORAL_TABLET | Freq: Three times a day (TID) | ORAL | Status: DC | PRN

## 2020-08-30 MED ORDER — LACTATED RINGERS IV SOLN: INTRAVENOUS | Status: DC

## 2020-08-30 MED ORDER — MIDAZOLAM HCL 2 MG/2ML IJ SOLN
INTRAMUSCULAR | Status: AC
  Filled 2020-08-30: qty 2

## 2020-08-30 MED ORDER — AMISULPRIDE (ANTIEMETIC) 5 MG/2ML IV SOLN: 10.0000 mg | Freq: Once | INTRAVENOUS | Status: DC | PRN

## 2020-08-30 MED ORDER — DEXAMETHASONE SODIUM PHOSPHATE 10 MG/ML IJ SOLN
INTRAMUSCULAR | Status: DC | PRN
  Administered 2020-08-30: 10 mg via INTRAVENOUS

## 2020-08-30 MED ORDER — ROCURONIUM BROMIDE 10 MG/ML (PF) SYRINGE
PREFILLED_SYRINGE | INTRAVENOUS | Status: DC | PRN
  Administered 2020-08-30: 10 mg via INTRAVENOUS
  Administered 2020-08-30: 70 mg via INTRAVENOUS

## 2020-08-30 MED ORDER — DEXMEDETOMIDINE (PRECEDEX) IN NS 20 MCG/5ML (4 MCG/ML) IV SYRINGE: PREFILLED_SYRINGE | INTRAVENOUS | Status: DC | PRN

## 2020-08-30 MED ORDER — LIDOCAINE 2% (20 MG/ML) 5 ML SYRINGE
INTRAMUSCULAR | Status: DC | PRN
  Administered 2020-08-30: 80 mg via INTRAVENOUS

## 2020-08-30 MED ORDER — DOCUSATE SODIUM 100 MG PO CAPS
100.0000 mg | ORAL_CAPSULE | Freq: Two times a day (BID) | ORAL | Status: DC
  Administered 2020-08-30 – 2020-08-31 (×2): 100 mg via ORAL
  Filled 2020-08-30 (×2): qty 1

## 2020-08-30 MED ORDER — FENTANYL CITRATE (PF) 100 MCG/2ML IJ SOLN: 25.0000 ug | INTRAMUSCULAR | Status: DC | PRN

## 2020-08-30 MED ORDER — PROPOFOL 10 MG/ML IV BOLUS
INTRAVENOUS | Status: AC
  Filled 2020-08-30: qty 20

## 2020-08-30 MED ORDER — CEFAZOLIN SODIUM-DEXTROSE 1-4 GM/50ML-% IV SOLN
1.0000 g | Freq: Four times a day (QID) | INTRAVENOUS | Status: AC
  Administered 2020-08-30 – 2020-08-31 (×3): 1 g via INTRAVENOUS
  Filled 2020-08-30 (×3): qty 50

## 2020-08-30 MED ORDER — 0.9 % SODIUM CHLORIDE (POUR BTL) OPTIME
TOPICAL | Status: DC | PRN
  Administered 2020-08-30: 1000 mL

## 2020-08-30 MED ORDER — ROCURONIUM BROMIDE 10 MG/ML (PF) SYRINGE
PREFILLED_SYRINGE | INTRAVENOUS | Status: AC
  Filled 2020-08-30: qty 10

## 2020-08-30 SURGICAL SUPPLY — 80 items
BAG COUNTER SPONGE SURGICOUNT (BAG) ×2 IMPLANT
BANDAGE ESMARK 6X9 LF (GAUZE/BANDAGES/DRESSINGS) ×1 IMPLANT
BIT DRILL CALIBR DIST 2.8 (BIT) ×2 IMPLANT
BIT DRILL CALIBR PROX 2.8 (BIT) ×2 IMPLANT
BLADE CLIPPER SURG (BLADE) ×2 IMPLANT
BLADE SURG 10 STRL SS (BLADE) ×2 IMPLANT
BLADE SURG 15 STRL LF DISP TIS (BLADE) ×1 IMPLANT
BLADE SURG 15 STRL SS (BLADE) ×1
BNDG COHESIVE 4X5 TAN STRL (GAUZE/BANDAGES/DRESSINGS) ×2 IMPLANT
BNDG ELASTIC 4X5.8 VLCR STR LF (GAUZE/BANDAGES/DRESSINGS) ×2 IMPLANT
BNDG ELASTIC 6X5.8 VLCR STR LF (GAUZE/BANDAGES/DRESSINGS) ×2 IMPLANT
BNDG ESMARK 6X9 LF (GAUZE/BANDAGES/DRESSINGS) ×2
BNDG GAUZE ELAST 4 BULKY (GAUZE/BANDAGES/DRESSINGS) IMPLANT
BRUSH SCRUB EZ PLAIN DRY (MISCELLANEOUS) ×4 IMPLANT
CANISTER SUCT 3000ML PPV (MISCELLANEOUS) ×2 IMPLANT
COVER SURGICAL LIGHT HANDLE (MISCELLANEOUS) ×2 IMPLANT
CUFF TOURN SGL QUICK 34 (TOURNIQUET CUFF) ×1
CUFF TRNQT CYL 34X4.125X (TOURNIQUET CUFF) ×1 IMPLANT
DRAPE C-ARM 42X72 X-RAY (DRAPES) ×2 IMPLANT
DRAPE C-ARMOR (DRAPES) ×2 IMPLANT
DRAPE HALF SHEET 40X57 (DRAPES) ×6 IMPLANT
DRAPE INCISE IOBAN 66X45 STRL (DRAPES) ×2 IMPLANT
DRAPE U-SHAPE 47X51 STRL (DRAPES) ×2 IMPLANT
DRSG ADAPTIC 3X8 NADH LF (GAUZE/BANDAGES/DRESSINGS) IMPLANT
DRSG MEPITEL 4X7.2 (GAUZE/BANDAGES/DRESSINGS) ×2 IMPLANT
DRSG PAD ABDOMINAL 8X10 ST (GAUZE/BANDAGES/DRESSINGS) IMPLANT
ELECT REM PT RETURN 9FT ADLT (ELECTROSURGICAL) ×2
ELECTRODE REM PT RTRN 9FT ADLT (ELECTROSURGICAL) ×1 IMPLANT
GAUZE SPONGE 4X4 12PLY STRL (GAUZE/BANDAGES/DRESSINGS) ×2 IMPLANT
GLOVE SRG 8 PF TXTR STRL LF DI (GLOVE) ×1 IMPLANT
GLOVE SURG ENC MOIS LTX SZ7.5 (GLOVE) ×2 IMPLANT
GLOVE SURG ENC MOIS LTX SZ8 (GLOVE) ×2 IMPLANT
GLOVE SURG UNDER POLY LF SZ7.5 (GLOVE) ×2 IMPLANT
GLOVE SURG UNDER POLY LF SZ8 (GLOVE) ×1
GOWN STRL REUS W/ TWL LRG LVL3 (GOWN DISPOSABLE) ×2 IMPLANT
GOWN STRL REUS W/ TWL XL LVL3 (GOWN DISPOSABLE) ×1 IMPLANT
GOWN STRL REUS W/TWL LRG LVL3 (GOWN DISPOSABLE) ×2
GOWN STRL REUS W/TWL XL LVL3 (GOWN DISPOSABLE) ×1
IMMOBILIZER KNEE 22 UNIV (SOFTGOODS) IMPLANT
KIT BASIN OR (CUSTOM PROCEDURE TRAY) ×2 IMPLANT
KIT TURNOVER KIT B (KITS) ×2 IMPLANT
NDL SUT 6 .5 CRC .975X.05 MAYO (NEEDLE) IMPLANT
NEEDLE 18GX1X1/2 (RX/OR ONLY) (NEEDLE) ×2 IMPLANT
NEEDLE MAYO TAPER (NEEDLE)
NS IRRIG 1000ML POUR BTL (IV SOLUTION) ×2 IMPLANT
PACK ORTHO EXTREMITY (CUSTOM PROCEDURE TRAY) ×2 IMPLANT
PAD ARMBOARD 7.5X6 YLW CONV (MISCELLANEOUS) ×4 IMPLANT
PAD CAST 4YDX4 CTTN HI CHSV (CAST SUPPLIES) ×1 IMPLANT
PADDING CAST COTTON 4X4 STRL (CAST SUPPLIES) ×1
PADDING CAST COTTON 6X4 STRL (CAST SUPPLIES) ×2 IMPLANT
PIN GUIDE 1.5X230 (PIN) ×4 IMPLANT
PLATE TIB PLT 5H RT (Plate) ×2 IMPLANT
SCREW LOCK FIXED 4X80 (Screw) ×4 IMPLANT
SCREW LOCK VA 3.5X80 (Screw) ×4 IMPLANT
SCREW NLOCK WORKHORSE 3.5X30 (Screw) ×2 IMPLANT
SCREW NLOCK WORKHORSE 3.5X32 (Screw) ×2 IMPLANT
SCREW NLOCK WORKHORSE 3.5X34 (Screw) ×4 IMPLANT
SCREW NLOCK WORKHORSE 3.5X75 (Screw) ×2 IMPLANT
SCREW NLOCK WORKHORSE 3.5X85 (Screw) ×2 IMPLANT
SPONGE T-LAP 18X18 ~~LOC~~+RFID (SPONGE) ×4 IMPLANT
STAPLER VISISTAT 35W (STAPLE) ×2 IMPLANT
STOCKINETTE IMPERVIOUS LG (DRAPES) ×2 IMPLANT
SUCTION FRAZIER HANDLE 10FR (MISCELLANEOUS) ×1
SUCTION TUBE FRAZIER 10FR DISP (MISCELLANEOUS) ×1 IMPLANT
SUT ETHILON 2 0 PSLX (SUTURE) ×4 IMPLANT
SUT ETHILON 3 0 PS 1 (SUTURE) IMPLANT
SUT PROLENE 0 CT 2 (SUTURE) ×2 IMPLANT
SUT VIC AB 0 CT1 27 (SUTURE) ×1
SUT VIC AB 0 CT1 27XBRD ANBCTR (SUTURE) ×1 IMPLANT
SUT VIC AB 1 CT1 27 (SUTURE) ×1
SUT VIC AB 1 CT1 27XBRD ANBCTR (SUTURE) ×1 IMPLANT
SUT VIC AB 2-0 CT1 27 (SUTURE) ×2
SUT VIC AB 2-0 CT1 TAPERPNT 27 (SUTURE) ×2 IMPLANT
SYR 30ML SLIP (SYRINGE) ×2 IMPLANT
TOWEL GREEN STERILE (TOWEL DISPOSABLE) ×4 IMPLANT
TOWEL GREEN STERILE FF (TOWEL DISPOSABLE) ×2 IMPLANT
TRAY FOLEY MTR SLVR 16FR STAT (SET/KITS/TRAYS/PACK) IMPLANT
TUBE CONNECTING 12X1/4 (SUCTIONS) ×2 IMPLANT
WATER STERILE IRR 1000ML POUR (IV SOLUTION) ×4 IMPLANT
YANKAUER SUCT BULB TIP NO VENT (SUCTIONS) ×2 IMPLANT

## 2020-08-30 NOTE — Op Note (Signed)
08/30/2020 9:50 PM  PATIENT:  Bryan Peterson  70 y.o. male [id number]  PRE-OPERATIVE DIAGNOSIS:   1. RIGHT BICONDYLAR TIBIAL PLATEAU FRACTURE 2. RIGHT KNEE HEMARTHROSIS  POST-OPERATIVE DIAGNOSIS:   1. RIGHT BICONDYLAR TIBIAL PLATEAU FRACTURE 2. RIGHT KNEE HEMARTHROSIS  PROCEDURE:  Procedure(s): 1. OPEN REDUCTION INTERNAL FIXATION (ORIF) BICONDYLAR TIBIAL PLATEAU RIGHT 2. ANTERIOR COMPARTMENT FASCIOTOMY 3. ASPIRATION OF RIGHT KNEE HEMARTHROSIS  SURGEON:  Surgeon(s) and Role:    Myrene Galas, MD - Primary  PHYSICIAN ASSISTANT: NONE.  ANESTHESIA:   general  EBL:  100 mL   BLOOD ADMINISTERED:none  DRAINS: none   LOCAL MEDICATIONS USED:  NONE  SPECIMEN: None  DISPOSITION OF SPECIMEN:  N/A  COUNTS:  YES  TOURNIQUET:  * No tourniquets in log *  DICTATION: Written below.  PLAN OF CARE: Admit to inpatient   PATIENT DISPOSITION:  PACU - hemodynamically stable.   Delay start of Pharmacological VTE agent (>24hrs) due to surgical blood loss or risk of bleeding: no     BRIEF SUMMARY AND INDICATION FOR PROCEDURE:  Patient is a 70 y.o.-year- old with a tibial plateau fracture, treated provisionally with immediate immobilization, elevation, and active motion of the foot and toes to facilitate resolution of soft tissue swelling.  We did discuss with the patient the risks and benefits of surgical treatment including the potential for arthritis, nerve injury, vessel injury, loss of motion, DVT, PE, heart attack, stroke, symptomatic hardware, need for further surgery, and multiple others.  The patient acknowledged these risks and wished to proceed.   BRIEF SUMMARY OF PROCEDURE:  After administration of preoperative antibiotics, the patient was taken to the operating room.  General anesthesia was induced and the lower extremity prepped and draped in usual sterile fashion using a chlorhexidine wash and betadine scrub and paint. A timeout was performed. I then brought in the  radiolucent triangle. We began on the lateral side, using C-arm to identify the fracture site exit in the metaphysis laterally, medially, and posteriorly. A curvilinear incision was made anterolaterally inferior to Gerdy's tubercle. Dissection was carried down where the soft tissues were left intact to the lateral plateau and rim, elevated only the insertion of the extensors. I was then able to insert the Arthrex 5 hole tibial plateau plate and pin it proximally at the joint line. I checked plate position on AP and lateral views at the joint line and placed additional fixation with standard screws. I then disimpacted the fracture site, pulled traction, and derotated the fracture putting bumps under the ankle. I then placed three bicortical screws distally completing restoration of tibial plateau slope and apposing the plate to the bone. Addition fication was then placed proximally. Final images showed appropriate reduction, implant position and length. All wounds were irrigated thoroughly.   Prior to closure, I turned my attention to the distal edge of the wound here underneath the skin.  I used the long scissors to spread both superficial and deep to the anterior compartment.  The fascia was then released for 8 to 10 cm to reduce the likelihood of the postoperative compartment syndrome.  Once more, wound was irrigated and then a standard layered closure performed, 0 Vicryl, 2-0 Vicryl, and 3-0 nylon for the skin.  Lastly, the large knee hemarthrosis was aspirated removing 110 cc of blood. Sterile gently compressive dressing was applied and in knee immobilizer.  The patient was taken to the PACU in stable condition.    PROGNOSIS: The patient will not require formal bracing given the  stable examination post fixation in full extension and at 30 degrees of flextion. Unrestricted range of motion will begin immediately.  He will be nonweightbearing on the operative extremity, be on pharmacologic DVT  prophylaxis, and mobilized with PT and OT. After discharge, we will plan to see him back in about 2 weeks for removal of sutures. Discharge anticipated in two days.         Doralee Albino. Carola Frost, M.D.

## 2020-08-30 NOTE — Transfer of Care (Signed)
Immediate Anesthesia Transfer of Care Note  Patient: Bryan Peterson  Procedure(s) Performed: OPEN REDUCTION INTERNAL FIXATION (ORIF) TIBIAL PLATEAU, RIGHT LEG FASCIOTOMY ANTERIOR COMPARTENT, ASPIRATION OF RIGHT KNEE (Right: Leg Upper)  Patient Location: PACU  Anesthesia Type:General  Level of Consciousness: drowsy, patient cooperative and responds to stimulation  Airway & Oxygen Therapy: Patient Spontanous Breathing and Patient connected to face mask oxygen  Post-op Assessment: Report given to RN, Post -op Vital signs reviewed and stable and Patient moving all extremities X 4  Post vital signs: Reviewed and stable  Last Vitals:  Vitals Value Taken Time  BP 108/82 08/30/20 1120  Temp    Pulse 82 08/30/20 1121  Resp 17 08/30/20 1121  SpO2 100 % 08/30/20 1121  Vitals shown include unvalidated device data.  Last Pain:  Vitals:   08/30/20 0813  TempSrc:   PainSc: 0-No pain      Patients Stated Pain Goal: 3 (08/30/20 0813)  Complications: No notable events documented.

## 2020-08-30 NOTE — Plan of Care (Signed)
?  Problem: Clinical Measurements: ?Goal: Will remain free from infection ?Outcome: Progressing ?  ?

## 2020-08-30 NOTE — Progress Notes (Signed)
Orthopedic Tech Progress Note Patient Details:  Bryan Peterson 11/29/1950 [id number]  Called in order to [employer] for a ROM KNEE BRACE UNLOCKED   Patient ID: Bryan Peterson, male   DOB: [birth year], 70 y.o.   MRN: [id number]  Bryan Peterson 08/30/2020, 2:23 PM

## 2020-08-30 NOTE — Anesthesia Procedure Notes (Addendum)
Procedure Name: Intubation Date/Time: 08/30/2020 8:55 AM Performed by: Michele Rockers, CRNA Pre-anesthesia Checklist: Patient identified, Patient being monitored, Timeout performed, Emergency Drugs available and Suction available Patient Re-evaluated:Patient Re-evaluated prior to induction Oxygen Delivery Method: Circle System Utilized Preoxygenation: Pre-oxygenation with 100% oxygen Induction Type: IV induction Ventilation: Mask ventilation without difficulty Laryngoscope Size: Glidescope, Mac and 4 Grade View: Grade I Tube type: Oral Tube size: 8.0 mm Number of attempts: 1 Airway Equipment and Method: Stylet Placement Confirmation: ETT inserted through vocal cords under direct vision, positive ETCO2 and breath sounds checked- equal and bilateral Secured at: 23 cm Tube secured with: Tape Dental Injury: Teeth and Oropharynx as per pre-operative assessment  Difficulty Due To: Difficult Airway- due to anterior larynx Comments: Unable to view VC with Mill 2 or 3. Glide MAC 4 used. Grade 1

## 2020-08-30 NOTE — Plan of Care (Signed)

## 2020-08-30 NOTE — Anesthesia Postprocedure Evaluation (Signed)
Anesthesia Post Note  Patient: Bryan Peterson  Procedure(s) Performed: OPEN REDUCTION INTERNAL FIXATION (ORIF) TIBIAL PLATEAU, RIGHT LEG FASCIOTOMY ANTERIOR COMPARTENT, ASPIRATION OF RIGHT KNEE (Right: Leg Upper)     Patient location during evaluation: PACU Anesthesia Type: General Level of consciousness: awake and alert Pain management: pain level controlled Vital Signs Assessment: post-procedure vital signs reviewed and stable Respiratory status: spontaneous breathing, nonlabored ventilation and respiratory function stable Cardiovascular status: blood pressure returned to baseline and stable Postop Assessment: no apparent nausea or vomiting Anesthetic complications: no   No notable events documented.  Last Vitals:  Vitals:   08/30/20 1235 08/30/20 1301  BP: (!) 149/85 140/76  Pulse: 89 86  Resp: 11 14  Temp:  36.8 C  SpO2: 100% 100%    Last Pain:  Vitals:   08/30/20 1301  TempSrc: Oral  PainSc: 0-No pain                 Mellody Dance

## 2020-08-30 NOTE — Progress Notes (Signed)
Orthopedic Tech Progress Note Patient Details:  Bryan Peterson 11-19-1950 [id number]  Ortho Devices Type of Ortho Device: Bone foam zero knee Ortho Device/Splint Location: RLE Ortho Device/Splint Interventions: Ordered, Application, Adjustment   Post Interventions Patient Tolerated: Well Instructions Provided: Care of device  [name] 08/30/2020, 6:42 PM

## 2020-08-30 NOTE — TOC Progression Note (Signed)
Transition of Care [facility]) - Progression Note    Patient Details  Name: Bryan Peterson MRN: [id number] Date of Birth: [birth year]  [facility] CM/SW Contact  Ralene Bathe, LCSWA Phone Number: 08/30/2020, 3:48 PM  Clinical Narrative:     TOC following patient for any d/c planning needs once medically stable.  Pending PT/OT assessments and recommendations.  Cleon Gustin, MSW, LCSWA   Expected Discharge Plan: Home/Self Care (vs home health services) Barriers to Discharge: Continued Medical Work up  Expected Discharge Plan and Services Expected Discharge Plan: Home/Self Care (vs home health services)   Discharge Planning Services: CM Consult   Living arrangements for the past 2 months: Single Family Home                                       Social Determinants of Health (SDOH) Interventions    Readmission Risk Interventions No flowsheet data found.

## 2020-08-30 NOTE — Progress Notes (Addendum)
H/o cirrhosis. Was doing paracentesis until Feb of this year, now off and now off Lasix also. Off EtOH since 2019 for most part, none since Dec. Smokes 1 PPD.  Skin blisters anteriorly but skin still wrinkles in area of incision. Pattern is amendable to percutaneous plate system.   I discussed with the patient the risks and benefits of surgery for right plateau, including the possibility of infection, nerve injury, vessel injury, wound breakdown, arthritis, symptomatic hardware, DVT/ PE, loss of motion, malunion, nonunion, and need for further surgery among others.  We also specifically discussed the possible need to stage surgery because of the elevated risk of soft tissue breakdown that could lead to amputation.  He acknowledged these risks and wished to proceed.  Myrene Galas, MD [facility], [facility] [phone]

## 2020-08-31 ENCOUNTER — Other Ambulatory Visit (HOSPITAL_COMMUNITY): Payer: Self-pay

## 2020-08-31 ENCOUNTER — Encounter (HOSPITAL_COMMUNITY): Payer: Self-pay | Admitting: Student

## 2020-08-31 ENCOUNTER — Encounter: Payer: Self-pay | Admitting: Orthopedic Surgery

## 2020-08-31 DIAGNOSIS — F17200 Nicotine dependence, unspecified, uncomplicated: Secondary | ICD-10-CM | POA: Insufficient documentation

## 2020-08-31 LAB — BASIC METABOLIC PANEL
Anion gap: 6 (ref 5–15)
BUN: 17 mg/dL (ref 8–23)
CO2: 23 mmol/L (ref 22–32)
Calcium: 8.6 mg/dL — ABNORMAL LOW (ref 8.9–10.3)
Chloride: 105 mmol/L (ref 98–111)
Creatinine, Ser: 1.38 mg/dL — ABNORMAL HIGH (ref 0.61–1.24)
GFR, Estimated: 55 mL/min — ABNORMAL LOW (ref >60)
Glucose, Bld: 140 mg/dL — ABNORMAL HIGH (ref 70–99)
Potassium: 4.2 mmol/L (ref 3.5–5.1)
Sodium: 134 mmol/L — ABNORMAL LOW (ref 135–145)

## 2020-08-31 LAB — CBC
HCT: 28.4 % — ABNORMAL LOW (ref 39.0–52.0)
Hemoglobin: 9.5 g/dL — ABNORMAL LOW (ref 13.0–17.0)
MCH: 31.8 pg (ref 26.0–34.0)
MCHC: 33.5 g/dL (ref 30.0–36.0)
MCV: 95 fL (ref 80.0–100.0)
Platelets: 174 10*3/uL (ref 150–400)
RBC: 2.99 MIL/uL — ABNORMAL LOW (ref 4.22–5.81)
RDW: 13 % (ref 11.5–15.5)
WBC: 10.5 10*3/uL (ref 4.0–10.5)
nRBC: 0 % (ref 0.0–0.2)

## 2020-08-31 MED ORDER — METHOCARBAMOL 500 MG PO TABS
500.0000 mg | ORAL_TABLET | Freq: Four times a day (QID) | ORAL | 0 refills | Status: AC | PRN
  Filled 2020-08-31: qty 60, 8d supply, fill #0

## 2020-08-31 MED ORDER — RIVAROXABAN 15 MG PO TABS
15.0000 mg | ORAL_TABLET | Freq: Every day | ORAL | 0 refills | Status: AC
  Filled 2020-08-31: qty 30, 30d supply, fill #0

## 2020-08-31 MED ORDER — ACETAMINOPHEN 325 MG PO TABS
650.0000 mg | ORAL_TABLET | Freq: Four times a day (QID) | ORAL | 0 refills | Status: AC
  Filled 2020-08-31: qty 60, 8d supply, fill #0

## 2020-08-31 MED ORDER — OXYCODONE HCL 5 MG PO TABS
5.0000 mg | ORAL_TABLET | Freq: Three times a day (TID) | ORAL | 0 refills | Status: AC | PRN
  Filled 2020-08-31: qty 30, 5d supply, fill #0

## 2020-08-31 MED ORDER — DOCUSATE SODIUM 100 MG PO CAPS
100.0000 mg | ORAL_CAPSULE | Freq: Two times a day (BID) | ORAL | 0 refills | Status: AC
  Filled 2020-08-31: qty 10, 5d supply, fill #0

## 2020-08-31 NOTE — Progress Notes (Signed)
Orthopaedic Trauma Service Progress Note  Patient IDTraylon Peterson MRN: [id number] DOB/AGE: 09-20-1950 70 y.o.  Subjective:  Doing great! Pain controlled Ready to go home   ROS As above  Objective:   VITALS:   Vitals:   08/30/20 1500 08/30/20 2053 08/31/20 0747 08/31/20 1316  BP: (!) 142/77 118/68 (!) 118/57 119/65  Pulse: 95 96 67 61  Resp: 16 14 18 20   Temp: 98.9 F (37.2 C) 98.4 F (36.9 C) 98.4 F (36.9 C) 98.3 F (36.8 C)  TempSrc: Oral  Oral Oral  SpO2: 98% 99% 100% 100%  Weight:      Height:        Estimated body mass index is 25.39 kg/m as calculated from the following:   Height as of this encounter: 5\' 8"  (1.727 m).   Weight as of this encounter: 75.8 kg.   Intake/Output      08/01 0701 08/02 0700 08/02 0701 08/03 0700   P.O. 100 240   I.V. (mL/kg) 1635.8 (21.6)    IV Piggyback 700    Total Intake(mL/kg) 2435.8 (32.2) 240 (3.2)   Urine (mL/kg/hr) 300 (0.2) 250 (0.5)   Blood 100    Total Output 400 250   Net +2035.8 -10          LABS  Results for orders placed or performed during the hospital encounter of 08/28/20 (from the past 24 hour(s))  Basic metabolic panel     Status: Abnormal   Collection Time: 08/31/20  2:00 AM  Result Value Ref Range   Sodium 134 (L) 135 - 145 mmol/L   Potassium 4.2 3.5 - 5.1 mmol/L   Chloride 105 98 - 111 mmol/L   CO2 23 22 - 32 mmol/L   Glucose, Bld 140 (H) 70 - 99 mg/dL   BUN 17 8 - 23 mg/dL   Creatinine, Ser 08/30/20 (H) 0.61 - 1.24 mg/dL   Calcium 8.6 (L) 8.9 - 10.3 mg/dL   GFR, Estimated 55 (L) >60 mL/min   Anion gap 6 5 - 15  CBC     Status: Abnormal   Collection Time: 08/31/20  2:00 AM  Result Value Ref Range   WBC 10.5 4.0 - 10.5 K/uL   RBC 2.99 (L) 4.22 - 5.81 MIL/uL   Hemoglobin 9.5 (L) 13.0 - 17.0 g/dL   HCT 4.96 (L) 10/31/20 - 75.9 %   MCV 95.0 80.0 - 100.0 fL   MCH 31.8 26.0 - 34.0 pg   MCHC 33.5 30.0 - 36.0 g/dL   RDW 16.3  84.6 - 65.9 %   Platelets 174 150 - 400 K/uL   nRBC 0.0 0.0 - 0.2 %     PHYSICAL EXAM:   Gen: resting comfortably in bed, sitting up, dressed in his street clothes  Lungs: unlabored  Cardiac: reg Abd: NT Ext:       Right Lower Extremity   Dressing is clean, dry and intact  Hinged brace is fitting well and is unlocked  Extremity is warm  + DP pulse  Compartments are soft  No pain out of proportion with passive stretching  DPN, SPN, TN sensory functions intact  EHL, FHL, lesser toe motor function intact.  Ankle flexion, extension, inversion and eversion are intact  Assessment/Plan: 1 Day Post-Op   Principal Problem:   Closed fracture of  right tibial plateau Active Problems:   Cirrhosis of liver (HCC)   Anti-infectives (From admission, onward)    Start     Dose/Rate Route Frequency Ordered Stop   08/30/20 1445  ceFAZolin (ANCEF) IVPB 1 g/50 mL premix        1 g 100 mL/hr over 30 Minutes Intravenous Every 6 hours 08/30/20 1347 08/31/20 0317   08/30/20 0600  ceFAZolin (ANCEF) IVPB 2g/100 mL premix        2 g 200 mL/hr over 30 Minutes Intravenous To Short Stay 08/29/20 2137 08/30/20 0840     .  POD/HD#: 44  70 year old male history of cirrhosis with acute right tibial plateau fracture after falling off of a stepladder  -Right tibial plateau fracture s/p ORIF Nonweightbearing right leg for 8 weeks Unrestricted range of motion right knee  Continue with hinged knee brace  Rest with knee in full extension Ice and elevate for swelling and pain control Dressing changes starting on 09/02/2020  PT- please teach HEP for right knee ROM- AROM, PROM. Prone exercises as well. No ROM restrictions.  Quad sets, SLR, LAQ, SAQ, heel slides, stretching, prone flexion and extension  Ankle theraband program, heel cord stretching, toe towel curls, etc  No pillows under bend of knee when at rest, ok to place under heel to help work on extension. Can also use zero knee bone foam if  available  Hinged knee brace on at all times, unlocked.  Ok to work on [employer] without brace with therapist supervision.  Brace back on after ROM session if removed    Pain management:  Multimodal  - ABL anemia/Hemodynamics  Stable  - DVT/PE prophylaxis:  Xarelto 15 mg daily for 4 weeks  - ID:   Perioperative antibiotics completed  - Metabolic Bone Disease:  Vitamin D levels low end of normal but stable  - Activity:  Activity as tolerated while maintaining weightbearing restrictions  - FEN/GI prophylaxis/Foley/Lines:  Regular diet  - Impediments to fracture healing:  History of cirrhosis  - Dispo:  Ortho issues are stable  Discharge home today  Follow-up with orthopedics in 2 weeks   Mearl Latin, PA-C [phone] (C) 08/31/2020, 2:14 PM  [facility] [address] [place] [address] [phone] [phone] (F)    After 5pm and on the weekends please log on to Amion, go to orthopaedics and the look under the [facility] Call for the provider(s) on call. You can also call our office at [phone] and then follow the prompts to be connected to the call team.

## 2020-08-31 NOTE — Progress Notes (Signed)
Orthopedic Tech Progress Note Patient Details:  [name] 1950/11/03 [id number] RN called requesting a pair of CRUTCHES    Ortho Devices Type of Ortho Device: Crutches Ortho Device/Splint Location: RLE Ortho Device/Splint Interventions: Application, Adjustment   Post Interventions Patient Tolerated: Well, Ambulated well Instructions Provided: Care of device, Poper ambulation with device  [name] 08/31/2020, 2:11 PM

## 2020-08-31 NOTE — TOC Transition Note (Signed)
Transition of Care [facility]) - CM/SW Discharge Note   Patient Details  Name: Bryan Peterson MRN: [id number] Date of Birth: 1950/05/03  [facility]) CM/SW Contact:  Epifanio Lesches, RN Phone Number: 08/31/2020, 2:15 PM   Clinical Narrative:    Patient will DC to: home  Anticipated DC date: 08/31/2020 Family notified: yes Transport by: car    S/P ORIF TIBIAL PLATEAU RIGHT, 8/1  Per MD patient ready for DC today. RN, patient, and patient's family aware of DC.Order placed for home  health  PT services. Pt agreeable. Preference : [facility]. Referral faxed to [facility] for home health PT services @ [phone]. Pt without DME needs, already has rolling walker and 3in1 / BBSC @ home.  Pt without  Rx med concerns. Post hospital follow up appointment noted on AVS.  Wife to provide transportation to home.  RNCM will sign off for now as intervention is no longer needed. Please consult [place] again if new needs arise.   Final next level of care: Home w Home Health Services Barriers to Discharge: Continued Medical Work up   Patient Goals and CMS Choice     Choice offered to / list presented to : Patient, Spouse  Discharge Placement                       Discharge Plan and Services   Discharge Planning Services: CM Consult                      HH Arranged: PT [facility] Agency: [facility] Date [facility] Agency Contacted: 08/31/20 Time HH Agency Contacted: 1404 Representative spoke with at [facility] Agency: Nicholos Johns  Social Determinants of Health (SDOH) Interventions     Readmission Risk Interventions No flowsheet data found.

## 2020-08-31 NOTE — TOC CAGE-AID Note (Signed)
Transition of Care [facility]) - CAGE-AID Screening   Patient Details  Name: Bryan Peterson MRN: [id number] Date of Birth: 1950-03-31  Transition of Care [facility]) CM/SW Contact:    Jada Kuhnert C Tarpley-Carter, LCSWA Phone Number: 08/31/2020, 10:55 AM   Clinical Narrative: Pt is unable to participate in Cage Aid.   Adalis Gatti Tarpley-Carter, MSW, LCSW-A Pronouns:  She/Her/Hers [facility]itions of Care Clinical Social Worker Direct Number:  [phone] [email or web address]   CAGE-AID Screening: Substance Abuse Screening unable to be completed due to: : Patient unable to participate

## 2020-08-31 NOTE — Discharge Instructions (Signed)
[facility] Discharge Instructions   General Discharge Instructions  Orthopaedic Injuries:  Right tibial plateau fracture treated with open reduction internal fixation using plate and screws  WEIGHT BEARING STATUS: Nonweightbearing right leg, use walker or crutches to mobilize  RANGE OF MOTION/ACTIVITY: Unrestricted range of motion right knee.  Do not let knee rest in flexion.  Use the blue bone foam to help you keep her leg straight at rest.  Do not place pillows under your knee as this can lead to a flexion contracture.  Activity as tolerated while maintaining weightbearing restrictions.  Be as active as you can be while maintaining weightbearing restrictions  Bone health: Labs show good vitamin D levels.  Would continue to supplement with vitamin D3 5000 international units daily  Wound Care: Daily wound care starting on 09/02/2020.  Please see below Discharge Wound Care Instructions  Do NOT apply any ointments, solutions or lotions to pin sites or surgical wounds.  These prevent needed drainage and even though solutions like hydrogen peroxide kill bacteria, they also damage cells lining the pin sites that help fight infection.  Applying lotions or ointments can keep the wounds moist and can cause them to breakdown and open up as well. This can increase the risk for infection. When in doubt call the office.  Surgical incisions should be dressed daily.  If any drainage is noted, use one layer of adaptic, then gauze, Kerlix, and an ace wrap.  Once the incision is completely dry and without drainage, it may be left open to air out.  Showering may begin 36-48 hours later.  Cleaning gently with soap and water.  Traumatic wounds should be dressed daily as well.    One layer of adaptic, gauze, Kerlix, then ace wrap.  The adaptic can be discontinued once the draining has ceased    If you have a wet to dry dressing: wet the gauze with saline the squeeze as much saline out so  the gauze is moist (not soaking wet), place moistened gauze over wound, then place a dry gauze over the moist one, followed by Kerlix wrap, then ace wrap.    DVT/PE prophylaxis: Xarelto 15 mg daily for 4 weeks  Diet: as you were eating previously.  Can use over the counter stool softeners and bowel preparations, such as Miralax, to help with bowel movements.  Narcotics can be constipating.  Be sure to drink plenty of fluids  PAIN MEDICATION USE AND EXPECTATIONS  You have likely been given narcotic medications to help control your pain.  After a traumatic event that results in an fracture (broken bone) with or without surgery, it is ok to use narcotic pain medications to help control one's pain.  We understand that everyone responds to pain differently and each individual patient will be evaluated on a regular basis for the continued need for narcotic medications. Ideally, narcotic medication use should last no more than 6-8 weeks (coinciding with fracture healing).   As a patient it is your responsibility as well to monitor narcotic medication use and report the amount and frequency you use these medications when you come to your office visit.   We would also advise that if you are using narcotic medications, you should take a dose prior to therapy to maximize you participation.  IF YOU ARE ON NARCOTIC MEDICATIONS IT IS NOT PERMISSIBLE TO OPERATE A MOTOR VEHICLE (MOTORCYCLE/CAR/TRUCK/MOPED) OR HEAVY MACHINERY DO NOT MIX NARCOTICS WITH OTHER CNS (CENTRAL NERVOUS SYSTEM) DEPRESSANTS SUCH AS ALCOHOL  POST-OPERATIVE OPIOID TAPER INSTRUCTIONS: It is important to wean off of your opioid medication as soon as possible. If you do not need pain medication after your surgery it is ok to stop day one. Opioids include: Codeine, Hydrocodone(Norco, Vicodin), Oxycodone(Percocet, oxycontin) and hydromorphone amongst others.  Long term and even short term use of opiods can cause: Increased pain  response Dependence Constipation Depression Respiratory depression And more.  Withdrawal symptoms can include Flu like symptoms Nausea, vomiting And more Techniques to manage these symptoms Hydrate well Eat regular healthy meals Stay active Use relaxation techniques(deep breathing, meditating, yoga) Do Not substitute Alcohol to help with tapering If you have been on opioids for less than two weeks and do not have pain than it is ok to stop all together.  Plan to wean off of opioids This plan should start within one week post op of your fracture surgery  Maintain the same interval or time between taking each dose and first decrease the dose.  Cut the total daily intake of opioids by one tablet each day Next start to increase the time between doses. The last dose that should be eliminated is the evening dose.    STOP SMOKING OR USING NICOTINE PRODUCTS!!!!  As discussed nicotine severely impairs your body's ability to heal surgical and traumatic wounds but also impairs bone healing.  Wounds and bone heal by forming microscopic blood vessels (angiogenesis) and nicotine is a vasoconstrictor (essentially, shrinks blood vessels).  Therefore, if vasoconstriction occurs to these microscopic blood vessels they essentially disappear and are unable to deliver necessary nutrients to the healing tissue.  This is one modifiable factor that you can do to dramatically increase your chances of healing your injury.    (This means no smoking, no nicotine gum, patches, etc)  DO NOT USE NONSTEROIDAL ANTI-INFLAMMATORY DRUGS (NSAID'S)  Using products such as Advil (ibuprofen), Aleve (naproxen), Motrin (ibuprofen) for additional pain control during fracture healing can delay and/or prevent the healing response.  If you would like to take over the counter (OTC) medication, Tylenol (acetaminophen) is ok.  However, some narcotic medications that are given for pain control contain acetaminophen as well. Therefore,  you should not exceed more than 4000 mg of tylenol in a day if you do not have liver disease.  Also note that there are may OTC medicines, such as cold medicines and allergy medicines that my contain tylenol as well.  If you have any questions about medications and/or interactions please ask your doctor/PA or your pharmacist.      ICE AND ELEVATE INJURED/OPERATIVE EXTREMITY  Using ice and elevating the injured extremity above your heart can help with swelling and pain control.  Icing in a pulsatile fashion, such as 20 minutes on and 20 minutes off, can be followed.    Do not place ice directly on skin. Make sure there is a barrier between to skin and the ice pack.    Using frozen items such as frozen peas works well as the conform nicely to the are that needs to be iced.  USE AN ACE WRAP OR TED HOSE FOR SWELLING CONTROL  In addition to icing and elevation, Ace wraps or TED hose are used to help limit and resolve swelling.  It is recommended to use Ace wraps or TED hose until you are informed to stop.    When using Ace Wraps start the wrapping distally (farthest away from the body) and wrap proximally (closer to the body)   Example: If you had surgery  on your leg or thing and you do not have a splint on, start the ace wrap at the toes and work your way up to the thigh        If you had surgery on your upper extremity and do not have a splint on, start the ace wrap at your fingers and work your way up to the upper arm  IF YOU ARE IN A SPLINT OR CAST DO NOT REMOVE IT FOR ANY REASON   If your splint gets wet for any reason please contact the office immediately. You may shower in your splint or cast as long as you keep it dry.  This can be done by wrapping in a cast cover or garbage back (or similar)  Do Not stick any thing down your splint or cast such as pencils, money, or hangers to try and scratch yourself with.  If you feel itchy take benadryl as prescribed on the bottle for itching  IF YOU ARE IN  A CAM BOOT (BLACK BOOT)  You may remove boot periodically. Perform daily dressing changes as noted below.  Wash the liner of the boot regularly and wear a sock when wearing the boot. It is recommended that you sleep in the boot until told otherwise    Call office for the following: Temperature greater than 101F Persistent nausea and vomiting Severe uncontrolled pain Redness, tenderness, or signs of infection (pain, swelling, redness, odor or green/yellow discharge around the site) Difficulty breathing, headache or visual disturbances Hives Persistent dizziness or light-headedness Extreme fatigue Any other questions or concerns you may have after discharge  In an emergency, call 911 or go to an Emergency Department at a nearby hospital  HELPFUL INFORMATION  If you had a block, it will wear off between 8-24 hrs postop typically.  This is period when your pain may go from nearly zero to the pain you would have had postop without the block.  This is an abrupt transition but nothing dangerous is happening.  You may take an extra dose of narcotic when this happens.  You should wean off your narcotic medicines as soon as you are able.  Most patients will be off or using minimal narcotics before their first postop appointment.   We suggest you use the pain medication the first night prior to going to bed, in order to ease any pain when the anesthesia wears off. You should avoid taking pain medications on an empty stomach as it will make you nauseous.  Do not drink alcoholic beverages or take illicit drugs when taking pain medications.  In most states it is against the law to drive while you are in a splint or sling.  And certainly against the law to drive while taking narcotics.  You may return to work/school in the next couple of days when you feel up to it.   Pain medication may make you constipated.  Below are a few solutions to try in this order: Decrease the amount of pain medication if  you aren't having pain. Drink lots of decaffeinated fluids. Drink prune juice and/or each dried prunes  If the first 3 don't work start with additional solutions Take Colace - an over-the-counter stool softener Take Senokot - an over-the-counter laxative Take Miralax - a stronger over-the-counter laxative     CALL THE OFFICE WITH ANY QUESTIONS OR CONCERNS: [phone]   VISIT OUR WEBSITE FOR ADDITIONAL INFORMATION: [email or web address]

## 2020-08-31 NOTE — Discharge Summary (Signed)
Orthopaedic Trauma Service (OTS) Discharge Summary   Patient ID: Bryan Peterson MRN: [id number] DOB/AGE: 10-21-1950 70 y.o.  Admit date: 08/28/2020 Discharge date: 08/31/2020  Admission Diagnoses: Closed right tibial plateau fracture, bicondylar History of cirrhosis of liver  Discharge Diagnoses:  Principal Problem:   Closed fracture of right tibial plateau Active Problems:   Cirrhosis of liver (HCC)   Nicotine dependence   Past Medical History:  Diagnosis Date   Cirrhosis of liver (HCC) 04/30/2017   Nicotine dependence      Procedures Performed: 08/30/2020-Dr. Carola Frost  ORIF right tibial plateau Right anterior compartment fasciotomy  Discharged Condition: good  Hospital Course:   Patient is a 70 year old male who presented to [facility] emergency department on 08/28/2020.  Patient sustained a fall off of a stepladder at his home in [place].  He was seen and evaluated at a local hospital however they felt that his injury was outside the scope of their practice and he was referred to [facility] for further management.  Patient was seen and evaluated by the orthopedic trauma service on 08/28/2020 for admission.  Isolated injury noted.  Patient was admitted to the orthopedic service.  He does have chronic cirrhosis of his liver but it is stable at this time.  Patient was then taken to the operating room on 08/30/2020 for the procedure noted above.  Patient tolerated procedure well.  After surgery he was transferred to the PACU for recovery from anesthesia and then transferred to the orthopedic floor for observation, pain control therapies.  On postoperative day 1 patient was doing fantastic.  His pain is well controlled he was mobilizing with therapies and was felt comfortable discharging home.  Patient was covered with Lovenox during his hospital stay and will be discharged on Xarelto for 4 weeks for DVT and PE prophylaxis.  His LFTs are within normal range as well.  He is  having adequate pain control with Tylenol and an occasional oxycodone.  Patient received Ancef for perioperative antibiosis.  Vitamin D levels were checked which were low normal.  No other concerning findings were noted during his hospital stay.  Patient discharged in stable condition on 08/31/2020.  At the time of discharge patient was tolerating a regular diet, voiding without difficulty and + flatus   Consults: None  Significant Diagnostic Studies: labs: Results for RICHARDS, PHERIGO (MRN [id number]) as of 08/31/2020 14:10  Ref. Range 08/30/2020 01:23 08/31/2020 02:00  Sodium Latest Ref Range: 135 - 145 mmol/L 133 (L) 134 (L)  Potassium Latest Ref Range: 3.5 - 5.1 mmol/L 3.9 4.2  Chloride Latest Ref Range: 98 - 111 mmol/L 105 105  CO2 Latest Ref Range: 22 - 32 mmol/L 21 (L) 23  Glucose Latest Ref Range: 70 - 99 mg/dL 782 (H) 956 (H)  BUN Latest Ref Range: 8 - 23 mg/dL 15 17  Creatinine Latest Ref Range: 0.61 - 1.24 mg/dL 2.13 (H) 0.86 (H)  Calcium Latest Ref Range: 8.9 - 10.3 mg/dL 8.4 (L) 8.6 (L)  Anion gap Latest Ref Range: 5 - Alkaline Phosphatase Latest Ref Range: 38 - 126 U/L 72   Albumin Latest Ref Range: 3.5 - 5.0 g/dL 2.9 (L)   AST Latest Ref Range: 15 - 41 U/L 18   ALT Latest Ref Range: 0 - 44 U/L 11   Total Protein Latest Ref Range: 6.5 - 8.1 g/dL 5.5 (L)   Total Bilirubin Latest Ref Range: 0.3 - 1.2 mg/dL 1.0   GFR, Estimated Latest Ref  Range: >60 mL/min >60 55 (L)  WBC Latest Ref Range: 4.0 - 10.5 K/uL 6.2 10.5  RBC Latest Ref Range: 4.22 - 5.81 MIL/uL 3.54 (L) 2.99 (L)  Hemoglobin Latest Ref Range: 13.0 - 17.0 g/dL 08.6 (L) 9.5 (L)  HCT Latest Ref Range: 39.0 - 52.0 % 33.7 (L) 28.4 (L)  MCV Latest Ref Range: 80.0 - 100.0 fL 95.2 95.0  MCH Latest Ref Range: 26.0 - 34.0 pg 31.6 31.8  MCHC Latest Ref Range: 30.0 - 36.0 g/dL 57.8 46.9  RDW Latest Ref Range: 11.5 - 15.5 % 13.0 13.0  Platelets Latest Ref Range: 150 - 400 K/uL 196 174  nRBC Latest Ref Range: 0.0 - 0.2 % 0.0 0.0   Prothrombin Time Latest Ref Range: 11.4 - 15.2 seconds 15.2   INR Latest Ref Range: 0.8 - 1.2  1.2    Results for BRYSTON, COLOCHO (MRN [id number]) as of 08/31/2020 14:10  Ref. Range 08/28/2020 22:10  Vitamin D, 25-Hydroxy Latest Ref Range: 30 - 100 ng/mL 35.31    Treatments: IV hydration, antibiotics: Ancef, analgesia: Dilaudid, Tylenol and oxycodone, anticoagulation: Lovenox while inpatient and Xarelto at discharge, therapies: PT, OT, and RN, and surgery: As above  Discharge Exam:            Orthopaedic Trauma Service Progress Note   Patient IDParam Peterson MRN: [id number] DOB/AGE: 05/31/1950 70 y.o.   Subjective:   Doing great! Pain controlled Ready to go home    ROS As above   Objective:   VITALS:         Vitals:    08/30/20 1500 08/30/20 2053 08/31/20 0747 08/31/20 1316  BP: (!) 142/77 118/68 (!) 118/57 119/65  Pulse: 95 96 67 61  Resp: Temp: 98.9 F (37.2 C) 98.4 F (36.9 C) 98.4 F (36.9 C) 98.3 F (36.8 C)  TempSrc: Oral   Oral Oral  SpO2: 98% 99% 100% 100%  Weight:          Height:              Estimated body mass index is 25.39 kg/m as calculated from the following:   Height as of this encounter:  (1.727 m).   Weight as of this encounter: 75.8 kg.     Intake/Output      08/01 0701 08/02 0700 08/02 0701 08/03 0700  P.O. 100 240  I.V. (mL/kg) 1635.8 (21.6)    IV Piggyback 700    Total Intake(mL/kg) 2435.8 (32.2) 240 (3.2)  Urine (mL/kg/hr) 300 (0.2) 250 (0.5)  Blood 100    Total Output 400 250  Net +2035.8 -10          LABS   Lab Results Last 24 Hours       Results for orders placed or performed during the hospital encounter of 08/28/20 (from the past 24 hour(s))  Basic metabolic panel     Status: Abnormal    Collection Time: 08/31/20  2:00 AM  Result Value Ref Range    Sodium 134 (L) 135 - 145 mmol/L    Potassium 4.2 3.5 - 5.1 mmol/L    Chloride 105 98 - 111 mmol/L    CO2 23 22 - 32 mmol/L    Glucose, Bld 140 (H) 70 -  99 mg/dL    BUN 17 8 - 23 mg/dL    Creatinine, Ser 5.36 (H) 0.61 - 1.24 mg/dL    Calcium 8.6 (L) 8.9 - 10.3 mg/dL    GFR, Estimated  55 (L) >60 mL/min    Anion gap 6 5 - 15  CBC     Status: Abnormal    Collection Time: 08/31/20  2:00 AM  Result Value Ref Range    WBC 10.5 4.0 - 10.5 K/uL    RBC 2.99 (L) 4.22 - 5.81 MIL/uL    Hemoglobin 9.5 (L) 13.0 - 17.0 g/dL    HCT 49.4 (L) 49.6 - 52.0 %    MCV 95.0 80.0 - 100.0 fL    MCH 31.8 26.0 - 34.0 pg    MCHC 33.5 30.0 - 36.0 g/dL    RDW 75.9 16.3 - 84.6 %    Platelets 174 150 - 400 K/uL    nRBC 0.0 0.0 - 0.2 %          PHYSICAL EXAM:   Gen: resting comfortably in bed, sitting up, dressed in his street clothes  Lungs: unlabored  Cardiac: reg Abd: NT Ext:       Right Lower Extremity              Dressing is clean, dry and intact             Hinged brace is fitting well and is unlocked             Extremity is warm             + DP pulse             Compartments are soft             No pain out of proportion with passive stretching             DPN, SPN, TN sensory functions intact             EHL, FHL, lesser toe motor function intact.  Ankle flexion, extension, inversion and eversion are intact   Assessment/Plan: 1 Day Post-Op    Principal Problem:   Closed fracture of right tibial plateau Active Problems:   Cirrhosis of liver (HCC)     Anti-infectives (From admission, onward)        Start     Dose/Rate Route Frequency Ordered Stop    08/30/20 1445   ceFAZolin (ANCEF) IVPB 1 g/50 mL premix        1 g 100 mL/hr over 30 Minutes Intravenous Every 6 hours 08/30/20 1347 08/31/20 0317    08/30/20 0600   ceFAZolin (ANCEF) IVPB 2g/100 mL premix        2 g 200 mL/hr over 30 Minutes Intravenous To Short Stay 08/29/20 2137 08/30/20 0840         .   POD/HD#: 35   70 year old male history of cirrhosis with acute right tibial plateau fracture after falling off of a stepladder  -Right tibial plateau fracture s/p  ORIF Nonweightbearing right leg for 8 weeks Unrestricted range of motion right knee             Continue with hinged knee brace             Rest with knee in full extension Ice and elevate for swelling and pain control Dressing changes starting on 09/02/2020   PT- please teach HEP for right knee ROM- AROM, PROM. Prone exercises as well. No ROM restrictions.  Quad sets, SLR, LAQ, SAQ, heel slides, stretching, prone flexion and extension   Ankle theraband program, heel cord stretching, toe towel curls, etc   No pillows under bend of knee when at rest, ok  to place under heel to help work on extension. Can also use zero knee bone foam if available   Hinged knee brace on at all times, unlocked.  Ok to work on [employer] without brace with therapist supervision.  Brace back on after ROM session if removed    Pain management:             Multimodal   - ABL anemia/Hemodynamics             Stable   - DVT/PE prophylaxis:             Xarelto 15 mg daily for 4 weeks   - ID:              Perioperative antibiotics completed   - Metabolic Bone Disease:             Vitamin D levels low end of normal but stable   - Activity:             Activity as tolerated while maintaining weightbearing restrictions   - FEN/GI prophylaxis/Foley/Lines:             Regular diet   - Impediments to fracture healing:             History of cirrhosis   - Dispo:             Ortho issues are stable             Discharge home today             Follow-up with orthopedics in 2 weeks    Disposition: Discharge disposition: 01-Home or Self Care      Discharge Instructions     Call MD / Call 911   Complete by: As directed    If you experience chest pain or shortness of breath, CALL 911 and be transported to the hospital emergency room.  If you develope a fever above 101 F, pus (white drainage) or increased drainage or redness at the wound, or calf pain, call your surgeon's office.   Constipation Prevention    Complete by: As directed    Drink plenty of fluids.  Prune juice may be helpful.  You may use a stool softener, such as Colace (over the counter) 100 mg twice a day.  Use MiraLax (over the counter) for constipation as needed.   Diet general   Complete by: As directed    Discharge instructions   Complete by: As directed    Orthopaedic Trauma Service Discharge Instructions   General Discharge Instructions  Orthopaedic Injuries:  Right tibial plateau fracture treated with open reduction internal fixation using plate and screws  WEIGHT BEARING STATUS: Nonweightbearing right leg, use walker or crutches to mobilize  RANGE OF MOTION/ACTIVITY: Unrestricted range of motion right knee.  Do not let knee rest in flexion.  Use the blue bone foam to help you keep her leg straight at rest.  Do not place pillows under your knee as this can lead to a flexion contracture.  Activity as tolerated while maintaining weightbearing restrictions.  Be as active as you can be while maintaining weightbearing restrictions  Bone health: Labs show good vitamin D levels.  Would continue to supplement with vitamin D3 5000 international units daily  Wound Care: Daily wound care starting on 09/02/2020.  Please see below Discharge Wound Care Instructions  Do NOT apply any ointments, solutions or lotions to pin sites or surgical wounds.  These prevent needed drainage and even though  solutions like hydrogen peroxide kill bacteria, they also damage cells lining the pin sites that help fight infection.  Applying lotions or ointments can keep the wounds moist and can cause them to breakdown and open up as well. This can increase the risk for infection. When in doubt call the office.  Surgical incisions should be dressed daily.  If any drainage is noted, use one layer of adaptic, then gauze, Kerlix, and an ace wrap.  Once the incision is completely dry and without drainage, it may be left open to air out.  Showering may begin  36-48 hours later.  Cleaning gently with soap and water.  Traumatic wounds should be dressed daily as well.    One layer of adaptic, gauze, Kerlix, then ace wrap.  The adaptic can be discontinued once the draining has ceased    If you have a wet to dry dressing: wet the gauze with saline the squeeze as much saline out so the gauze is moist (not soaking wet), place moistened gauze over wound, then place a dry gauze over the moist one, followed by Kerlix wrap, then ace wrap.    DVT/PE prophylaxis: Xarelto 15 mg daily for 4 weeks  Diet: as you were eating previously.  Can use over the counter stool softeners and bowel preparations, such as Miralax, to help with bowel movements.  Narcotics can be constipating.  Be sure to drink plenty of fluids  PAIN MEDICATION USE AND EXPECTATIONS  You have likely been given narcotic medications to help control your pain.  After a traumatic event that results in an fracture (broken bone) with or without surgery, it is ok to use narcotic pain medications to help control one's pain.  We understand that everyone responds to pain differently and each individual patient will be evaluated on a regular basis for the continued need for narcotic medications. Ideally, narcotic medication use should last no more than 6-8 weeks (coinciding with fracture healing).   As a patient it is your responsibility as well to monitor narcotic medication use and report the amount and frequency you use these medications when you come to your office visit.   We would also advise that if you are using narcotic medications, you should take a dose prior to therapy to maximize you participation.  IF YOU ARE ON NARCOTIC MEDICATIONS IT IS NOT PERMISSIBLE TO OPERATE A MOTOR VEHICLE (MOTORCYCLE/CAR/TRUCK/MOPED) OR HEAVY MACHINERY DO NOT MIX NARCOTICS WITH OTHER CNS (CENTRAL NERVOUS SYSTEM) DEPRESSANTS SUCH AS ALCOHOL   POST-OPERATIVE OPIOID TAPER INSTRUCTIONS:  It is important to wean off of your  opioid medication as soon as possible. If you do not need pain medication after your surgery it is ok to stop day one.  Opioids include:  o Codeine, Hydrocodone(Norco, Vicodin), Oxycodone(Percocet, oxycontin) and hydromorphone amongst others.   Long term and even short term use of opiods can cause:  o Increased pain response  o Dependence  o Constipation  o Depression  o Respiratory depression  o And more.   Withdrawal symptoms can include  o Flu like symptoms  o Nausea, vomiting  o And more  Techniques to manage these symptoms  o Hydrate well  o Eat regular healthy meals  o Stay active  o Use relaxation techniques(deep breathing, meditating, yoga)  Do Not substitute Alcohol to help with tapering  If you have been on opioids for less than two weeks and do not have pain than it is ok to stop all together.   Plan to wean  off of opioids  o This plan should start within one week post op of your fracture surgery   o Maintain the same interval or time between taking each dose and first decrease the dose.   o Cut the total daily intake of opioids by one tablet each day  o Next start to increase the time between doses.  o The last dose that should be eliminated is the evening dose.    STOP SMOKING OR USING NICOTINE PRODUCTS!!!!  As discussed nicotine severely impairs your body's ability to heal surgical and traumatic wounds but also impairs bone healing.  Wounds and bone heal by forming microscopic blood vessels (angiogenesis) and nicotine is a vasoconstrictor (essentially, shrinks blood vessels).  Therefore, if vasoconstriction occurs to these microscopic blood vessels they essentially disappear and are unable to deliver necessary nutrients to the healing tissue.  This is one modifiable factor that you can do to dramatically increase your chances of healing your injury.    (This means no smoking, no nicotine gum, patches, etc)  DO NOT USE NONSTEROIDAL ANTI-INFLAMMATORY DRUGS  (NSAID'S)  Using products such as Advil (ibuprofen), Aleve (naproxen), Motrin (ibuprofen) for additional pain control during fracture healing can delay and/or prevent the healing response.  If you would like to take over the counter (OTC) medication, Tylenol (acetaminophen) is ok.  However, some narcotic medications that are given for pain control contain acetaminophen as well. Therefore, you should not exceed more than 4000 mg of tylenol in a day if you do not have liver disease.  Also note that there are may OTC medicines, such as cold medicines and allergy medicines that my contain tylenol as well.  If you have any questions about medications and/or interactions please ask your doctor/PA or your pharmacist.      ICE AND ELEVATE INJURED/OPERATIVE EXTREMITY  Using ice and elevating the injured extremity above your heart can help with swelling and pain control.  Icing in a pulsatile fashion, such as 20 minutes on and 20 minutes off, can be followed.    Do not place ice directly on skin. Make sure there is a barrier between to skin and the ice pack.    Using frozen items such as frozen peas works well as the conform nicely to the are that needs to be iced.  USE AN ACE WRAP OR TED HOSE FOR SWELLING CONTROL  In addition to icing and elevation, Ace wraps or TED hose are used to help limit and resolve swelling.  It is recommended to use Ace wraps or TED hose until you are informed to stop.    When using Ace Wraps start the wrapping distally (farthest away from the body) and wrap proximally (closer to the body)   Example: If you had surgery on your leg or thing and you do not have a splint on, start the ace wrap at the toes and work your way up to the thigh        If you had surgery on your upper extremity and do not have a splint on, start the ace wrap at your fingers and work your way up to the upper arm  IF YOU ARE IN A SPLINT OR CAST DO NOT REMOVE IT FOR ANY REASON   If your splint gets wet for any  reason please contact the office immediately. You may shower in your splint or cast as long as you keep it dry.  This can be done by wrapping in a cast cover or garbage  back (or similar)  Do Not stick any thing down your splint or cast such as pencils, money, or hangers to try and scratch yourself with.  If you feel itchy take benadryl as prescribed on the bottle for itching  IF YOU ARE IN A CAM BOOT (BLACK BOOT)  You may remove boot periodically. Perform daily dressing changes as noted below.  Wash the liner of the boot regularly and wear a sock when wearing the boot. It is recommended that you sleep in the boot until told otherwise    Call office for the following: ? Temperature greater than 101F ? Persistent nausea and vomiting ? Severe uncontrolled pain ? Redness, tenderness, or signs of infection (pain, swelling, redness, odor or green/yellow discharge around the site) ? Difficulty breathing, headache or visual disturbances ? Hives ? Persistent dizziness or light-headedness ? Extreme fatigue ? Any other questions or concerns you may have after discharge  In an emergency, call 911 or go to an Emergency Department at a nearby hospital  HELPFUL INFORMATION  ? If you had a block, it will wear off between 8-24 hrs postop typically.  This is period when your pain may go from nearly zero to the pain you would have had postop without the block.  This is an abrupt transition but nothing dangerous is happening.  You may take an extra dose of narcotic when this happens.  ? You should wean off your narcotic medicines as soon as you are able.  Most patients will be off or using minimal narcotics before their first postop appointment.   ? We suggest you use the pain medication the first night prior to going to bed, in order to ease any pain when the anesthesia wears off. You should avoid taking pain medications on an empty stomach as it will make you nauseous.  ? Do not drink alcoholic beverages  or take illicit drugs when taking pain medications.  ? In most states it is against the law to drive while you are in a splint or sling.  And certainly against the law to drive while taking narcotics.  ? You may return to work/school in the next couple of days when you feel up to it.   ? Pain medication may make you constipated.  Below are a few solutions to try in this order:   ? Decrease the amount of pain medication if you aren't having pain.   ? Drink lots of decaffeinated fluids.   ? Drink prune juice and/or each dried prunes   o If the first 3 don't work start with additional solutions   ? Take Colace - an over-the-counter stool softener   ? Take Senokot - an over-the-counter laxative   ? Take Miralax - a stronger over-the-counter laxative     CALL THE OFFICE WITH ANY QUESTIONS OR CONCERNS: [phone]   VISIT OUR WEBSITE FOR ADDITIONAL INFORMATION: [email or web address]   Do not put a pillow under the knee. Place it under the heel.   Complete by: As directed    Or use bone foam instead of pillow   Driving restrictions   Complete by: As directed    No driving until further notice   Increase activity slowly as tolerated   Complete by: As directed    Non weight bearing   Complete by: As directed    Laterality: right   Extremity: Lower   Post-operative opioid taper instructions:   Complete by: As directed    POST-OPERATIVE OPIOID TAPER INSTRUCTIONS: It is  important to wean off of your opioid medication as soon as possible. If you do not need pain medication after your surgery it is ok to stop day one. Opioids include: Codeine, Hydrocodone(Norco, Vicodin), Oxycodone(Percocet, oxycontin) and hydromorphone amongst others.  Long term and even short term use of opiods can cause: Increased pain response Dependence Constipation Depression Respiratory depression And more.  Withdrawal symptoms can include Flu like symptoms Nausea, vomiting And more Techniques to manage  these symptoms Hydrate well Eat regular healthy meals Stay active Use relaxation techniques(deep breathing, meditating, yoga) Do Not substitute Alcohol to help with tapering If you have been on opioids for less than two weeks and do not have pain than it is ok to stop all together.  Plan to wean off of opioids This plan should start within one week post op of your joint replacement. Maintain the same interval or time between taking each dose and first decrease the dose.  Cut the total daily intake of opioids by one tablet each day Next start to increase the time between doses. The last dose that should be eliminated is the evening dose.         Allergies as of 08/31/2020       Reactions   Tuberculin Tests    False positive         Medication List     TAKE these medications    acetaminophen 325 MG tablet Commonly known as: TYLENOL Take 2 tablets (650 mg total) by mouth every 6 (six) hours.   docusate sodium 100 MG capsule Commonly known as: COLACE Take 1 capsule (100 mg total) by mouth 2 (two) times daily.   furosemide 20 MG tablet Commonly known as: LASIX Take 20 mg by mouth daily as needed for fluid.   methocarbamol 500 MG tablet Commonly known as: ROBAXIN Take 1-2 tablets (500-1,000 mg total) by mouth every 6 (six) hours as needed for muscle spasms.   omeprazole 20 MG capsule Commonly known as: PRILOSEC Take 20 mg by mouth daily.   oxyCODONE 5 MG immediate release tablet Commonly known as: Oxy IR/ROXICODONE Take 1-2 tablets (5-10 mg total) by mouth every 8 (eight) hours as needed for moderate pain or severe pain.   Rivaroxaban 15 MG Tabs tablet Commonly known as: XARELTO Take 1 tablet (15 mg total) by mouth daily with supper.   traZODone 50 MG tablet Commonly known as: DESYREL Take 50 mg by mouth at bedtime.               Durable Medical Equipment  (From admission, onward)           Start     Ordered   08/31/20 1411  For home use only  DME Crutches  Once        08/31/20 1411   08/31/20 1411  For home use only DME Walker rolling  Once       Question Answer Comment  Walker: With 5 Inch Wheels   Patient needs a walker to treat with the following condition Closed fracture of right tibial plateau      08/31/20 1411              Discharge Care Instructions  (From admission, onward)           Start     Ordered   08/31/20 0000  Non weight bearing       Question Answer Comment  Laterality right   Extremity Lower      08/31/20 1425  Follow-up Information     [facility] Home [facility] Follow up.   Why: Referral made with [facility] for home health PT services Contact information: [address] [place] [address] [phone]         Myrene Galas, MD. Schedule an appointment as soon as possible for a visit in 2 week(s).   Specialty: Orthopedic Surgery Contact information: [address] [place] [address] [phone]                 Discharge Instructions and Plan:  70 year old male s/p ORIF right tibial plateau fracture  Weightbearing: NWB RLE Insicional and dressing care: Daily dressing changes with Adaptic, 4 x 4 gauze, Ace wrap.  Alternatively can use Adaptic, 4 x 4's and TED hose.  Leave wound open to air once they are dry Orthopedic device(s):  Hinged knee brace, crutches/walker Showering: Okay to shower and clean wounds with soap and water only once they are dry VTE prophylaxis: Xarelto 15mg   daily x4 weeks Pain control: Multimodal analgesia including Tylenol, oxycodone and Robaxin Bone Health/Optimization: Recommend vitamin D3 5000 IUs daily Follow - up plan: 2 weeks Contact information: MD, Myrene Galas PA-C   Signed:  Montez Morita, PA-C [phone]) 08/31/2020, 2:27 PM  [facility] [address] [place] [phone] (F)

## 2020-08-31 NOTE — Progress Notes (Signed)
Occupational Therapy Evaluation Patient Details Name: Bryan Peterson MRN: [id number] DOB: [birth year] Today's Date: 08/31/2020    History of Present Illness Bryan Peterson is an 70 y.o. male who presented to emergency department in [place], [place] for evaluation of right leg pain after fall from step ladder.  He Peterson found to have right proximal tibia fracture and transferred to [facility]. 8/1 ORIF of tibial plateau, R leg fasciotomy anterior compartment, aspiration of R knee (NWB, unrestricted ROM). PMH: cirrhosis   Clinical Impression   Bryan Peterson evaluated s/p the above fall from a ladder. PTA pt Peterson indep in all ADL/IADLs. He lives in a home with 3 STE with his wife. Upon evaluation, pt is set up-min guard for all ADLs and transfers with RW. His wife stated that she will assist with lower body dressing and shower transfers as needed, and their sons plan to assist pt up the steps into his home at d/c. Pt does not need further acute OT. Recommend d/c home with no OT follow up.     Follow Up Recommendations  No OT follow up;Supervision - Intermittent    Equipment Recommendations  None recommended by OT       Precautions / Restrictions Precautions Precautions: Fall Required Braces or Orthoses: Other Brace Other Brace: Knee brace, unhinged Restrictions Weight Bearing Restrictions: Yes RLE Weight Bearing: Weight bearing as tolerated      Mobility Bed Mobility Overal bed mobility: Needs Assistance Bed Mobility: Supine to Sit     Supine to sit: Min assist     General bed mobility comments: pt in chair upon arrival    Transfers Overall transfer level: Needs assistance Equipment used: Rolling walker (2 wheeled) Transfers: Sit to/from Stand Sit to Stand: Min guard         General transfer comment: maintains precautions well, demonstrates great hand placement    Balance Overall balance assessment: History of Falls;Needs assistance   Sitting balance-Leahy Scale: Good     Standing balance  support: Bilateral upper extremity supported Standing balance-Leahy Scale: Fair             ADL either performed or assessed with clinical judgement   ADL Overall ADL's : Needs assistance/impaired Eating/Feeding: Independent;Sitting   Grooming: Set up;Sitting   Upper Body Bathing: Set up;Sitting   Lower Body Bathing: Min guard;Sit to/from stand   Upper Body Dressing : Set up;Sitting   Lower Body Dressing: Minimal assistance;Sit to/from stand   Toilet Transfer: Min guard;Cueing for safety;Stand-pivot;BSC;RW   Toileting- Architect and Hygiene: Supervision/safety;Sitting/lateral lean       Functional mobility during ADLs: Min guard;Rolling walker       Vision Baseline Vision/History: No visual deficits Patient Visual Report: No change from baseline Vision Assessment?: No apparent visual deficits      Pertinent Vitals/Pain Pain Assessment: Faces Pain Score: 2  Faces Pain Scale: Hurts a little bit Pain Location: R knee Pain Descriptors / Indicators: Aching Pain Intervention(s): Monitored during session     Hand Dominance Right   Extremity/Trunk Assessment Upper Extremity Assessment Upper Extremity Assessment: Overall WFL for tasks assessed   Lower Extremity Assessment Lower Extremity Assessment: Defer to PT evaluation RLE Deficits / Details: Grossly 2/5   Cervical / Trunk Assessment Cervical / Trunk Assessment: Normal   Communication Communication Communication: No difficulties   Cognition Arousal/Alertness: Awake/alert Behavior During Therapy: WFL for tasks assessed/performed Overall Cognitive Status: Within Functional Limits for tasks assessed  General Comments: Alert and oriented x 4   General Comments  vss throughout, no complaints of incrased pain. excited to go home asap    Exercises Other Exercises Other Exercises: Pt. is educated on changing positions often, keeping LE in full extension while supine and working  on increasing knee flexion while seated. Other Exercises: PT demonstrates use of gait belt to promote therex ROM for knee flex, SLR and marching - pt. able to demonstrate activity without difficulty Other Exercises: PT answers wife/pt. questions regarding use of bike to increase ROM.    Home Living Family/patient expects to be discharged to:: Private residence Living Arrangements: Spouse/significant other Available Help at Discharge: Family Type of Home: House Home Access: Stairs to enter Secretary/administrator of Steps: 3 Entrance Stairs-Rails: Can reach both Home Layout: One level     Bathroom Shower/Tub: IT trainer: Standard Bathroom Accessibility: Yes   Home Equipment: Environmental consultant - 2 wheels;Bedside commode;Tub bench;Wheelchair - manual   Additional Comments: Pt's wife has had a hip replaced, they have DME from her recovery. She has great knowledge of how to use DME and states she will be with him and talk him through it      Prior Functioning/Environment Level of Independence: Independent                 OT Problem List: Decreased strength;Decreased range of motion;Decreased activity tolerance;Impaired balance (sitting and/or standing);Decreased safety awareness;Decreased knowledge of use of DME or AE;Decreased knowledge of precautions;Pain      OT Treatment/Interventions:      OT Goals(Current goals can be found in the care plan section) Acute Rehab OT Goals Patient Stated Goal: Pt's goal is to return to home. OT Goal Formulation: With patient   AM-PAC OT "6 Clicks" Daily Activity     Outcome Measure Help from another person eating meals?: None Help from another person taking care of personal grooming?: A Little Help from another person toileting, which includes using toliet, bedpan, or urinal?: A Little Help from another person bathing (including washing, rinsing, drying)?: A Little Help from another person to put on and taking off  regular upper body clothing?: None Help from another person to put on and taking off regular lower body clothing?: A Little 6 Click Score: 20   End of Session Equipment Utilized During Treatment: Gait belt;Rolling walker (knee brace) Nurse Communication: Mobility status;Precautions;Weight bearing status  Activity Tolerance: Patient tolerated treatment well Patient left: in chair;with call bell/phone within reach;with family/visitor present  OT Visit Diagnosis: Unsteadiness on feet (R26.81);Other abnormalities of gait and mobility (R26.89);History of falling (Z91.81);Pain                Time: 1140-1152 OT Time Calculation (min): 12 min Charges:  OT General Charges $OT Visit: 1 Visit OT Evaluation $OT Eval Low Complexity: 1 Low    Gemini Beaumier A Darrian Grzelak 08/31/2020, 12:39 PM

## 2020-08-31 NOTE — Evaluation (Signed)
Physical Therapy Evaluation Patient Details Name: Bryan Peterson MRN: [id number] DOB: 08-06-1950 Today's Date: 08/31/2020   History of Present Illness  Bryan Peterson is an 70 y.o. male who presented to emergency department in [place], [place] for evaluation of right leg pain after fall from step ladder.  He was found to have right proximal tibia fracture and transferred to [facility]. 8/1 ORIF of tibial plateau, R leg fasciotomy anterior compartment, aspiration of R knee (NWB, unrestricted ROM). PMH: cirrhosis  Clinical Impression  Pt. Is previously IND with ADLs and gait.  Currently requiring min A and use of AD for functional mobility s/p tibial ORIF.  Pt. Demos dec balance, gait deviations, and R LE weakness/dec ROM and would benefit from skilled PT to address deficits indicated in initial eval.  Pt. Is safe to return home with [facility] PT and assist from spouse/family.    Follow Up Recommendations Home health PT    Equipment Recommendations  Rolling walker with 5" wheels;Crutches    Recommendations for Other Services       Precautions / Restrictions Precautions Precautions: None Required Braces or Orthoses: Other Brace Other Brace: Knee brace, unhinged Restrictions Weight Bearing Restrictions: Yes RLE Weight Bearing: Non weight bearing      Mobility  Bed Mobility Overal bed mobility: Needs Assistance Bed Mobility: Supine to Sit     Supine to sit: Min assist     General bed mobility comments: Min A for LE negotiation during transfer to EOB. Patient Response: Cooperative  Transfers Overall transfer level: Needs assistance Equipment used: Rolling walker (2 wheeled)             General transfer comment: CGA for sit > stand.  PT demos appropriate transfer technique.  Pt. demos good carryover of safety with hand placement and is able to maintain precautions.  Ambulation/Gait Ambulation/Gait assistance: Min guard Gait Distance (Feet): 20 Feet Assistive device: Rolling walker (2  wheeled)       General Gait Details: 3 pt. gait with use of RW.  No LOB with activity.  Stairs Stairs: Yes Stairs assistance: Min assist Stair Management: One rail Right;With crutches Number of Stairs: 3 General stair comments: Attempted stair negotiation with B rails, pt. unable to complete activity.  Pt. practices with use of single crutch on  R side and handrail on L and is able to complete with min A.  Fair balance with activity.  States that they have sons nearby that can come and assist pt. into home.  Wheelchair Mobility    Modified Rankin (Stroke Patients Only)       Balance Overall balance assessment: History of Falls;Needs assistance         Standing balance support: Bilateral upper extremity supported Standing balance-Leahy Scale: Fair                               Pertinent Vitals/Pain Pain Assessment: 0-10 Pain Score: 2  Pain Location: R knee Pain Descriptors / Indicators: Aching Pain Intervention(s): Repositioned    Home Living Family/patient expects to be discharged to:: Private residence Living Arrangements: Spouse/significant other Available Help at Discharge: Family Type of Home: House Home Access: Stairs to enter Entrance Stairs-Rails: Can reach both Entrance Stairs-Number of Steps: 3 Home Layout: One level Home Equipment: Walker - standard Additional Comments: Discussed that 2WW would be better so pt. would not have to balance while advancing walker, however, pt. states his has tennis balls and can slide forward.  Prior Function Level of Independence: Independent               Hand Dominance        Extremity/Trunk Assessment   Upper Extremity Assessment Upper Extremity Assessment: Defer to OT evaluation    Lower Extremity Assessment Lower Extremity Assessment: RLE deficits/detail RLE Deficits / Details: Grossly 2/5       Communication   Communication: No difficulties  Cognition Arousal/Alertness:  Awake/alert Behavior During Therapy: WFL for tasks assessed/performed Overall Cognitive Status: Within Functional Limits for tasks assessed                                 General Comments: Alert and oriented x 4      General Comments General comments (skin integrity, edema, etc.): Pt. is educated on car transfers.  Demos understanding.    Exercises Other Exercises Other Exercises: Pt. is educated on changing positions often, keeping LE in full extension while supine and working on increasing knee flexion while seated. Other Exercises: PT demonstrates use of gait belt to promote therex ROM for knee flex, SLR and marching - pt. able to demonstrate activity without difficulty Other Exercises: PT answers wife/pt. questions regarding use of bike to increase ROM.   Assessment/Plan    PT Assessment Patient needs continued PT services  PT Problem List Decreased strength;Decreased mobility;Decreased range of motion;Decreased activity tolerance;Decreased balance;Pain       PT Treatment Interventions DME instruction;Gait training;Balance training;Therapeutic exercise;Stair training;Functional mobility training;Therapeutic activities;Patient/family education    PT Goals (Current goals can be found in the Care Plan section)  Acute Rehab PT Goals Patient Stated Goal: Pt's goal is to return to home. PT Goal Formulation: With patient/family Time For Goal Achievement: 09/14/20 Potential to Achieve Goals: Good    Frequency Min 5X/week   Barriers to discharge        Co-evaluation               AM-PAC PT "6 Clicks" Mobility  Outcome Measure Help needed turning from your back to your side while in a flat bed without using bedrails?: None Help needed moving from lying on your back to sitting on the side of a flat bed without using bedrails?: A Little Help needed moving to and from a bed to a chair (including a wheelchair)?: A Little Help needed standing up from a chair  using your arms (e.g., wheelchair or bedside chair)?: A Little Help needed to walk in hospital room?: A Little Help needed climbing 3-5 steps with a railing? : A Little 6 Click Score: 19    End of Session Equipment Utilized During Treatment: Gait belt Activity Tolerance: Patient tolerated treatment well Patient left: in chair;with call bell/phone within reach (No chair alarm, OT entering room.) Nurse Communication: Mobility status PT Visit Diagnosis: Unsteadiness on feet (R26.81);History of falling (Z91.81);Other abnormalities of gait and mobility (R26.89)    Time: 9622-2979 PT Time Calculation (min) (ACUTE ONLY): 49 min   Charges:   PT Evaluation $PT Eval Low Complexity: 1 Low PT Treatments $Gait Training: 8-22 mins $Therapeutic Activity: 8-22 mins        Bryan Peterson A. Bryan Peterson, PT, DPT Acute Rehabilitation Services Office: [phone]   Bryan Peterson A Bryan Peterson 08/31/2020, 11:59 AM

## 2020-08-31 NOTE — Plan of Care (Addendum)
Patient discharges home with wife. Meds given to patient by pharm. Crutches brought up by other. Patient stable IV removed. Patient educated.  Problem: Education: Goal: Knowledge of General Education information will improve Description: Including pain rating scale, medication(s)/side effects and non-pharmacologic comfort measures Outcome: Progressing   Problem: Activity: Goal: Risk for activity intolerance will decrease Outcome: Progressing   Problem: Pain Managment: Goal: General experience of comfort will improve Outcome: Progressing   Problem: Safety: Goal: Ability to remain free from injury will improve Outcome: Progressing   Problem: Skin Integrity: Goal: Risk for impaired skin integrity will decrease Outcome: Progressing

## 2020-09-06 ENCOUNTER — Other Ambulatory Visit (HOSPITAL_BASED_OUTPATIENT_CLINIC_OR_DEPARTMENT_OTHER): Payer: Self-pay

## 2020-11-23 ENCOUNTER — Other Ambulatory Visit (HOSPITAL_COMMUNITY): Payer: Self-pay

## 2023-02-18 IMAGING — RF DG KNEE COMPLETE 4+V*R*
1 series · 8 of 8 positions shown · non-contrast
Comparison: 08/28/2020.

CLINICAL DATA: ORIF right tibial plateau.

EXAM:
DG C-ARM 1-60 MIN; RIGHT KNEE - COMPLETE 4+ VIEW
FLUOROSCOPY TIME:  Fluoroscopy Time:  57 seconds.
Radiation Exposure Index (if provided by the fluoroscopic device):
4.05 mGy.
Number of Acquired Spot Images: A

[Series 1: run · 8 of 8 slices shown]
[im 1/8]
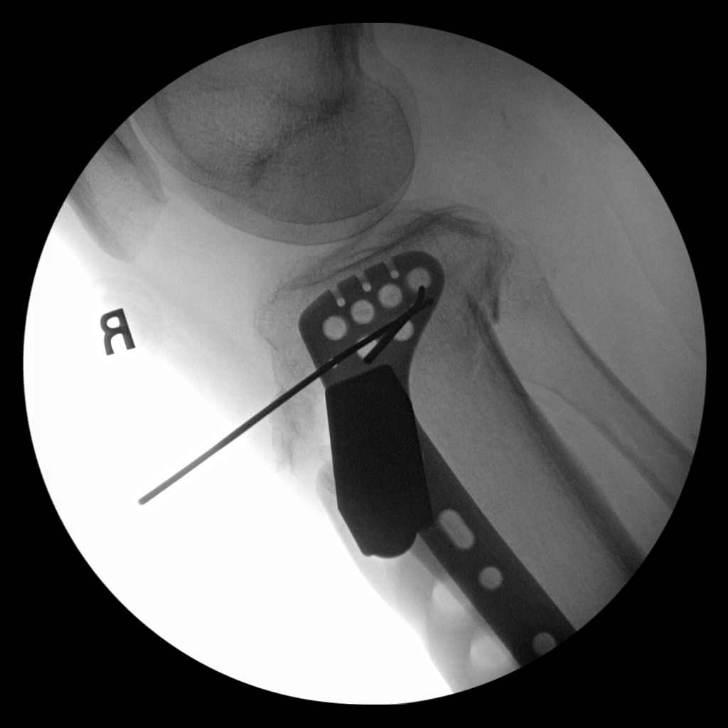
[im 2/8]
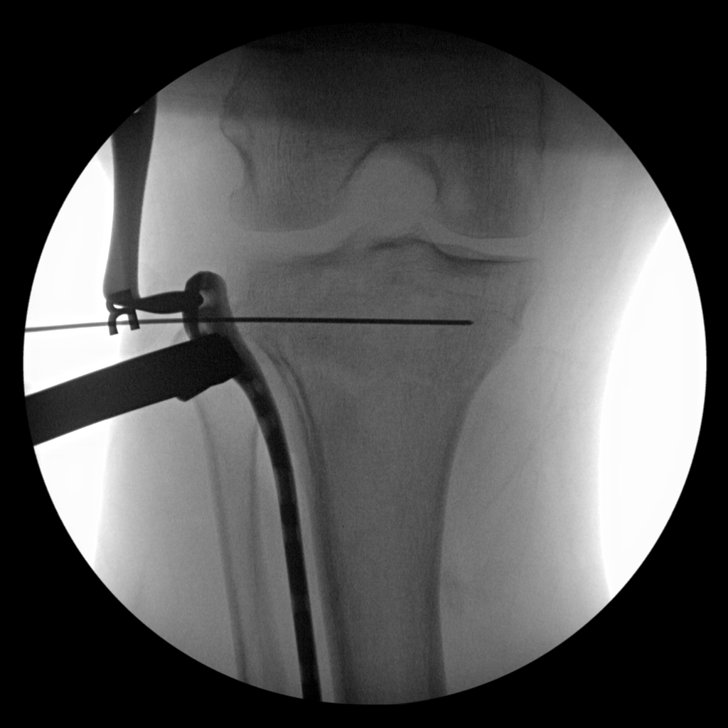
[im 3/8]
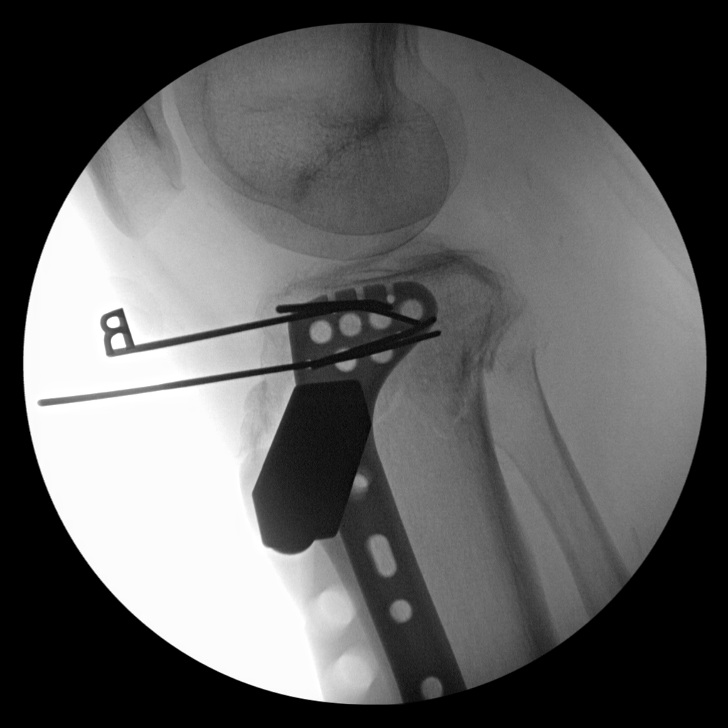
[im 4/8]
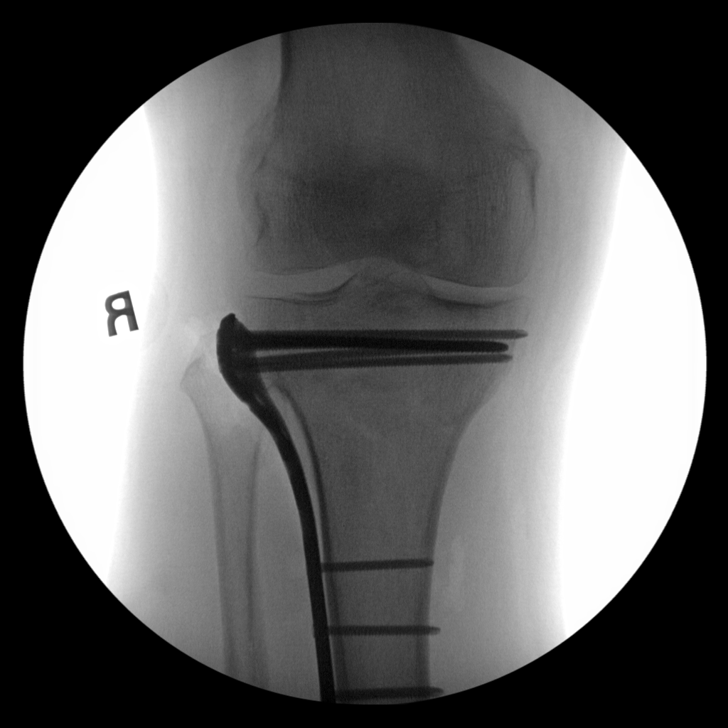
[im 5/8]
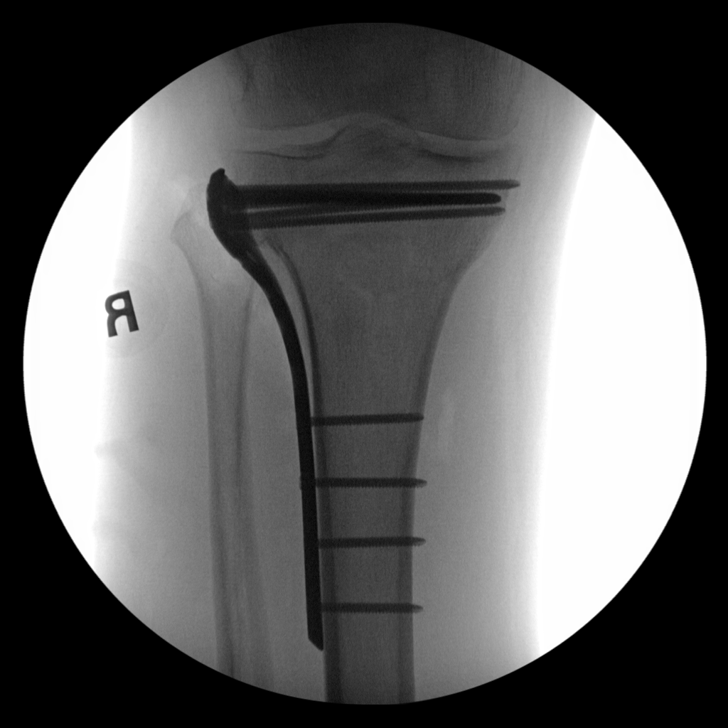
[im 6/8]
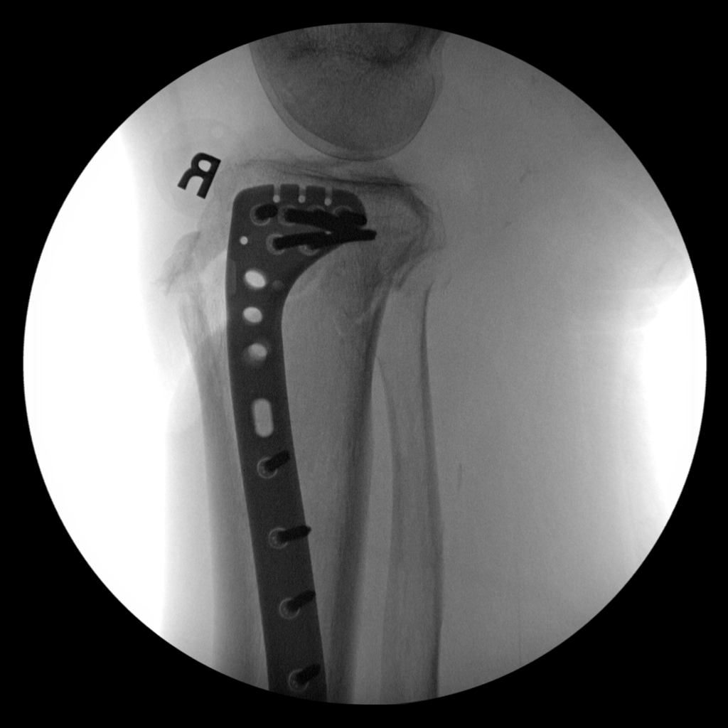
[im 7/8]
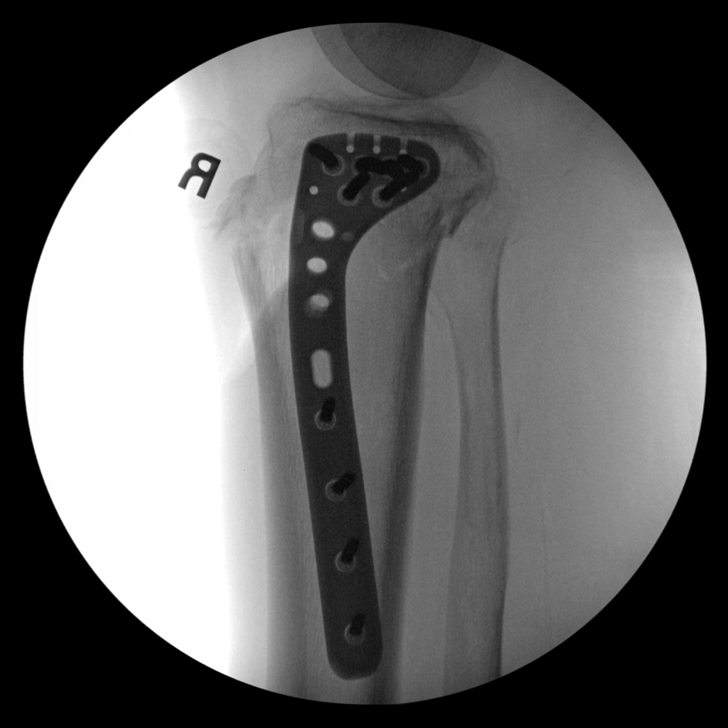
[im 8/8]
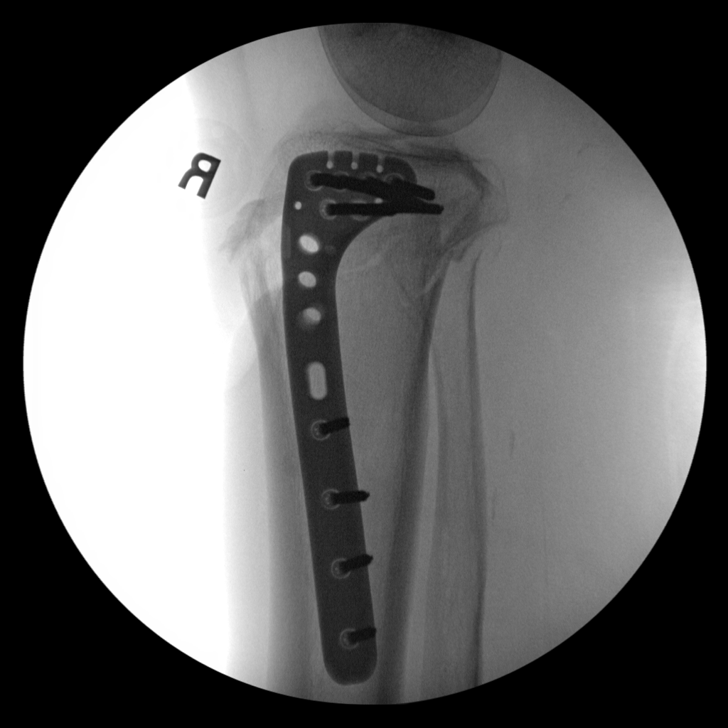

[8 of 8 positions shown; findings below may reference images not displayed]

FINDINGS: A C-arm fluoroscopic images were obtained intraoperatively and
submitted for post operative interpretation. These images
demonstrate sequential changes associated with plate screw fixation
of a proximal tibial metaphysis fracture. Slight posterior
angulation impaction. Vascular calcifications. Please see the
performing provider's procedural report for further detail.
IMPRESSION: Intraoperative fluoroscopy, as detailed above.

## 2023-02-18 IMAGING — DX DG KNEE 1-2V PORT*R*
2 series · 2 of 2 positions shown · non-contrast
Comparison: Preoperative imaging.

CLINICAL DATA: Post right tibial plateau plating for proximal tibia
fracture.

EXAM:
PORTABLE RIGHT KNEE - 1-2 VIEW

[knee ap]
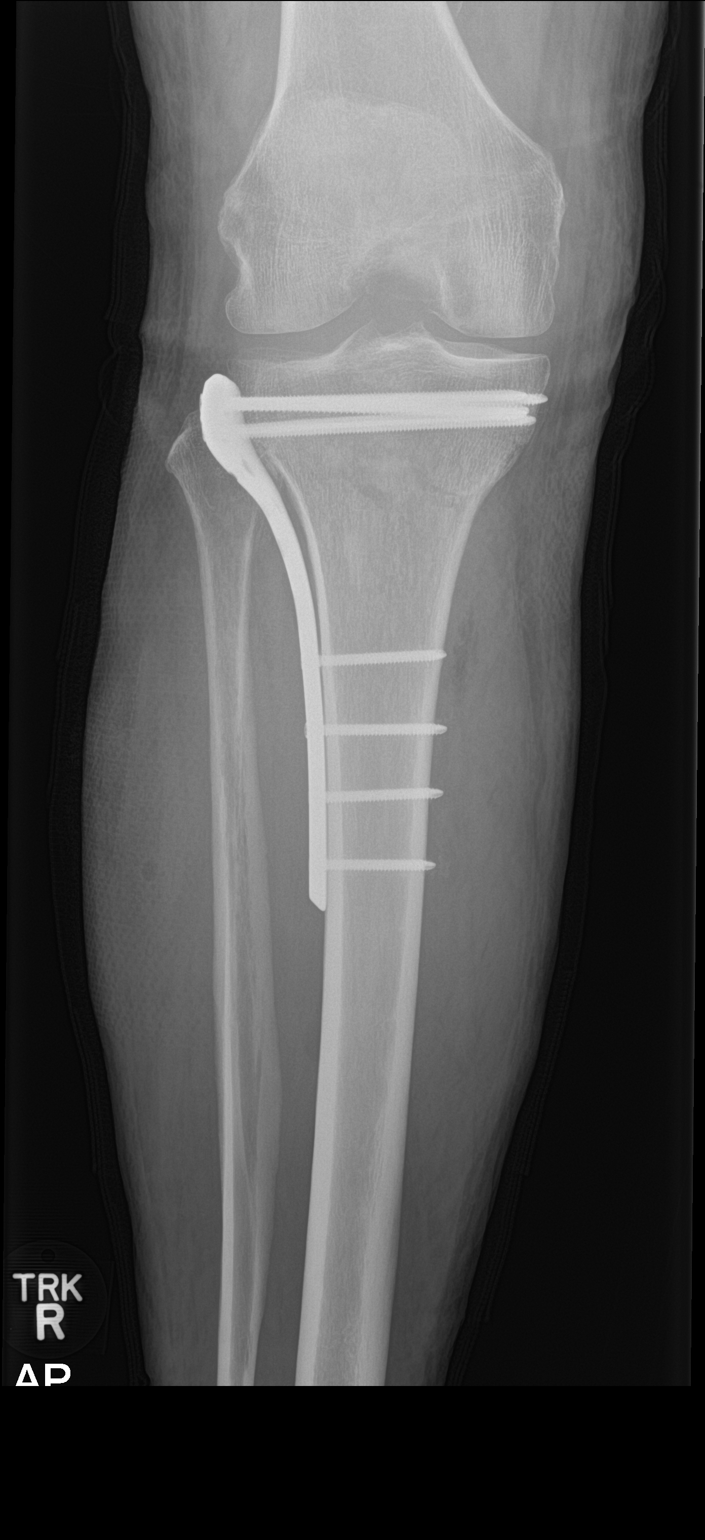

[knee lat]
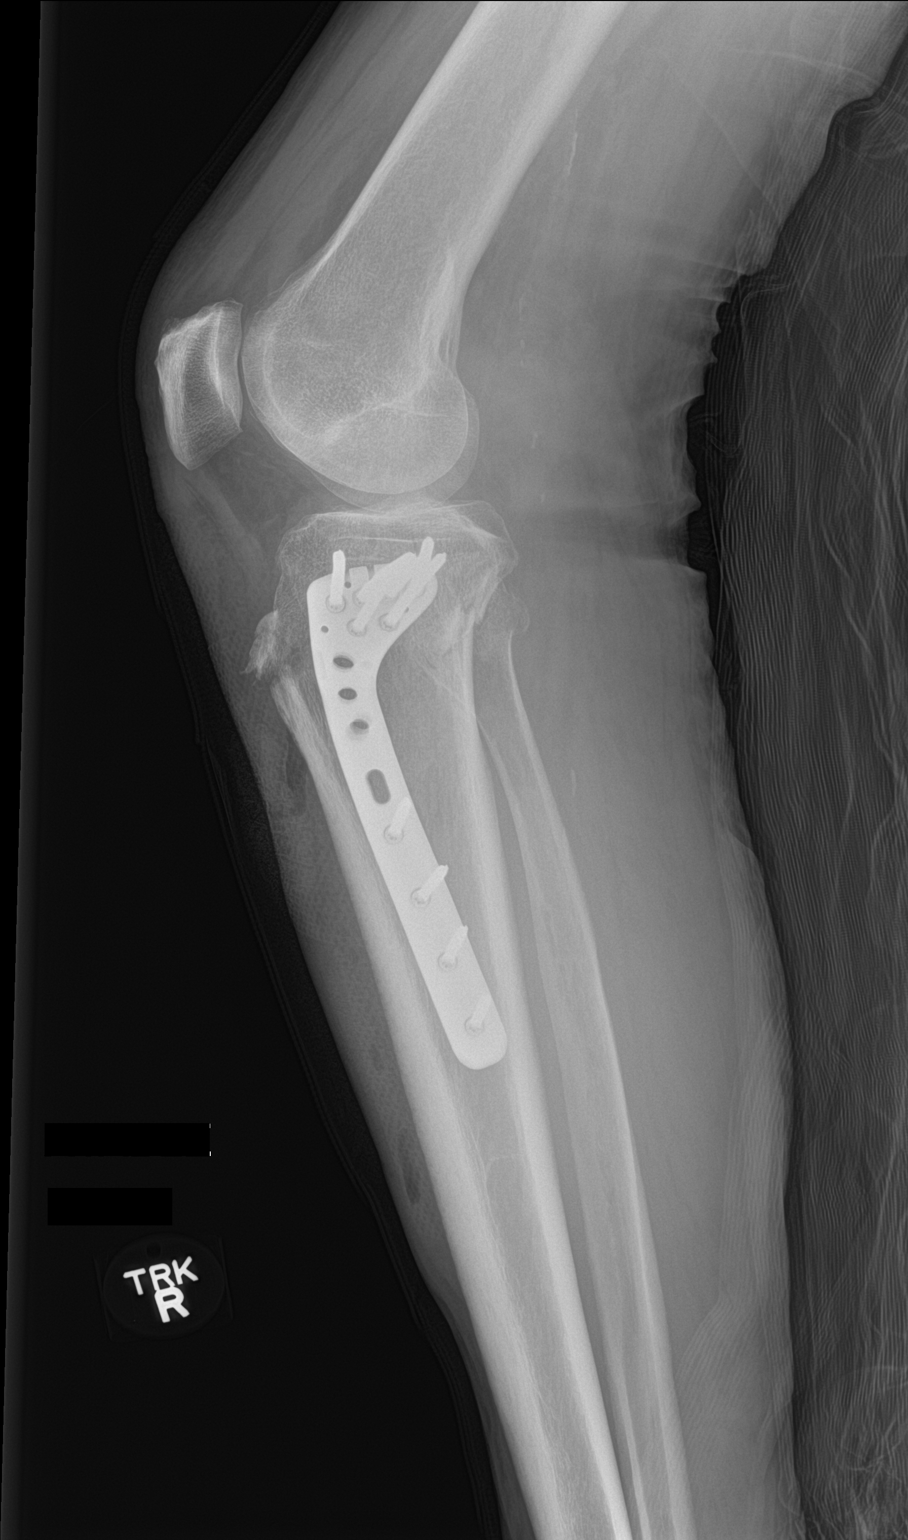

[2 of 2 positions shown; findings below may reference images not displayed]

FINDINGS: Lateral plate and multi screw fixation of proximal tibial fracture.
Fracture is in improved alignment. No periprosthetic lucency. There
is a possible nondisplaced fracture of the fibular neck appreciated
on the lateral view versus spur. Small knee joint effusion without
definite lipohemarthrosis. Recent postsurgical change includes air
and edema in the soft tissues.
IMPRESSION: 1. ORIF of proximal tibial fracture, in improved alignment.
2. Possible nondisplaced fibular neck fracture versus spurring.
